# Patient Record
Sex: Female | Born: 1950 | Hispanic: No | Marital: Married | Smoking: Never smoker
Health system: Southern US, Community
[De-identification: ages and names within clinical notes are randomized; demographics above are authoritative.]

## PROBLEM LIST (undated history)

## (undated) DIAGNOSIS — R7303 Prediabetes: Secondary | ICD-10-CM

## (undated) DIAGNOSIS — I10 Essential (primary) hypertension: Secondary | ICD-10-CM

## (undated) DIAGNOSIS — Z923 Personal history of irradiation: Secondary | ICD-10-CM

## (undated) DIAGNOSIS — F419 Anxiety disorder, unspecified: Secondary | ICD-10-CM

## (undated) HISTORY — PX: OTHER SURGICAL HISTORY: SHX169

## (undated) HISTORY — DX: Anxiety disorder, unspecified: F41.9

## (undated) HISTORY — DX: Essential (primary) hypertension: I10

---

## 2002-02-25 ENCOUNTER — Other Ambulatory Visit: Admission: RE | Admit: 2002-02-25 | Discharge: 2002-02-25 | Payer: Self-pay | Admitting: Obstetrics and Gynecology

## 2003-03-08 ENCOUNTER — Other Ambulatory Visit: Admission: RE | Admit: 2003-03-08 | Discharge: 2003-03-08 | Payer: Self-pay | Admitting: Obstetrics and Gynecology

## 2003-09-01 ENCOUNTER — Encounter: Admission: RE | Admit: 2003-09-01 | Discharge: 2003-09-01 | Payer: Self-pay | Admitting: Family Medicine

## 2004-03-22 ENCOUNTER — Other Ambulatory Visit: Admission: RE | Admit: 2004-03-22 | Discharge: 2004-03-22 | Payer: Self-pay | Admitting: Obstetrics and Gynecology

## 2005-03-28 ENCOUNTER — Other Ambulatory Visit: Admission: RE | Admit: 2005-03-28 | Discharge: 2005-03-28 | Payer: Self-pay | Admitting: Obstetrics and Gynecology

## 2005-08-22 ENCOUNTER — Emergency Department (HOSPITAL_COMMUNITY): Admission: EM | Admit: 2005-08-22 | Discharge: 2005-08-22 | Payer: Self-pay | Admitting: Emergency Medicine

## 2006-11-23 ENCOUNTER — Other Ambulatory Visit: Admission: RE | Admit: 2006-11-23 | Discharge: 2006-11-23 | Payer: Self-pay | Admitting: Obstetrics and Gynecology

## 2007-11-26 ENCOUNTER — Encounter: Payer: Self-pay | Admitting: Obstetrics and Gynecology

## 2007-11-26 ENCOUNTER — Other Ambulatory Visit: Admission: RE | Admit: 2007-11-26 | Discharge: 2007-11-26 | Payer: Self-pay | Admitting: Obstetrics and Gynecology

## 2007-11-26 ENCOUNTER — Ambulatory Visit: Payer: Self-pay | Admitting: Obstetrics and Gynecology

## 2008-11-30 ENCOUNTER — Other Ambulatory Visit: Admission: RE | Admit: 2008-11-30 | Discharge: 2008-11-30 | Payer: Self-pay | Admitting: Obstetrics and Gynecology

## 2008-11-30 ENCOUNTER — Ambulatory Visit: Payer: Self-pay | Admitting: Obstetrics and Gynecology

## 2008-11-30 ENCOUNTER — Encounter: Payer: Self-pay | Admitting: Obstetrics and Gynecology

## 2009-10-31 ENCOUNTER — Ambulatory Visit: Payer: Self-pay | Admitting: Obstetrics and Gynecology

## 2009-12-07 ENCOUNTER — Ambulatory Visit: Payer: Self-pay | Admitting: Obstetrics and Gynecology

## 2009-12-07 ENCOUNTER — Other Ambulatory Visit: Admission: RE | Admit: 2009-12-07 | Discharge: 2009-12-07 | Payer: Self-pay | Admitting: Obstetrics and Gynecology

## 2011-01-27 ENCOUNTER — Encounter: Payer: Self-pay | Admitting: Women's Health

## 2011-02-06 ENCOUNTER — Encounter: Payer: Self-pay | Admitting: Women's Health

## 2011-02-14 ENCOUNTER — Ambulatory Visit (INDEPENDENT_AMBULATORY_CARE_PROVIDER_SITE_OTHER): Payer: 59 | Admitting: Women's Health

## 2011-02-14 ENCOUNTER — Encounter: Payer: Self-pay | Admitting: Women's Health

## 2011-02-14 ENCOUNTER — Other Ambulatory Visit (HOSPITAL_COMMUNITY)
Admission: RE | Admit: 2011-02-14 | Discharge: 2011-02-14 | Disposition: A | Discharge status: Home / Self Care | Payer: 59 | Source: Ambulatory Visit | Attending: Women's Health | Admitting: Women's Health

## 2011-02-14 VITALS — BP 128/80 | Ht 65.0 in | Wt 150.5 lb

## 2011-02-14 DIAGNOSIS — Z780 Asymptomatic menopausal state: Secondary | ICD-10-CM

## 2011-02-14 DIAGNOSIS — Z01419 Encounter for gynecological examination (general) (routine) without abnormal findings: Secondary | ICD-10-CM | POA: Insufficient documentation

## 2011-02-14 DIAGNOSIS — Z13220 Encounter for screening for lipoid disorders: Secondary | ICD-10-CM

## 2011-02-14 DIAGNOSIS — Z833 Family history of diabetes mellitus: Secondary | ICD-10-CM

## 2011-02-14 NOTE — Progress Notes (Signed)
Angelica West March 24, 1950 [id number]    History:    The patient presents for annual exam.  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   Past medical history, past surgical history, family history and social history were all reviewed and documented in the EPIC chart.   ROS:  A  ROS was performed and pertinent positives and negatives are included in the history.  Exam:  Filed Vitals:   02/14/11 1548  BP: 128/80    General appearance:  Normal Head/Neck:  Normal, without cervical or supraclavicular adenopathy. Thyroid:  Symmetrical, normal in size, without palpable masses or nodularity. Respiratory  Effort:  Normal  Auscultation:  Clear without wheezing or rhonchi Cardiovascular  Auscultation:  Regular rate, without rubs, murmurs or gallops  Edema/varicosities:  Not grossly evident Abdominal  Soft,nontender, without masses, guarding or rebound.  Liver/spleen:  No organomegaly noted  Hernia:  None appreciated  Skin  Inspection:  Grossly normal  Palpation:  Grossly normal Neurologic/psychiatric  Orientation:  Normal with appropriate conversation.  Mood/affect:  Normal  Genitourinary    Breasts: Examined lying and sitting.     Right: Without masses, retractions, discharge or axillary adenopathy.     Left: Without masses, retractions, discharge or axillary adenopathy.   Inguinal/mons:  Normal without inguinal adenopathy  External genitalia:  Normal  BUS/Urethra/Skene's glands:  Normal  Bladder:  Normal  Vagina:  Normal  Cervix:  Normal  Uterus:   normal in size, shape and contour.  Midline not mobile/adhesive disease   Adnexa/parametria:     Rt: Without masses or tenderness.   Lt: Without masses or tenderness.  Anus and perineum: Normal  Digital rectal exam: Normal sphincter tone without palpated masses or tenderness  Assessment/Plan:  60 y.o. MBF G0 for annual exam. Postmenopausal with no bleeding or HRT. History of normal Paps and mammograms. History of a  negative colonoscopy, not sure of date will check with primary care.  Hypertension-primary care meds Normal postmenopausal exam with vaginal dryness  Plan: CBC, glucose, lipid profile, UA and Pap. Continue vitamin D 2000 daily, history of low vitamin D, normal after supplements. SBEs, continue annual screening, exercise, calcium rich diet, vaginal lubricants with intercourse encouraged. Mother with osteoporosis, has not had a DEXA will schedule. Discussed fall prevention and home safety. Encouraged zostovac and Tdap vaccine at primary care.    Harrington Challenger [facility], 4:36 PM 02/14/2011

## 2011-02-14 NOTE — Patient Instructions (Addendum)
zostavoc/ Shingles vaccine and tdap vaccine  Dexa schedule here

## 2011-02-15 LAB — GLUCOSE, RANDOM: Glucose, Bld: 91 mg/dL (ref 70–99)

## 2011-02-19 ENCOUNTER — Telehealth: Payer: Self-pay | Admitting: *Deleted

## 2011-02-19 NOTE — Telephone Encounter (Signed)
Pt called wanting result from last office, lm on pt vm with normal glucose level

## 2012-02-20 ENCOUNTER — Encounter: Payer: Self-pay | Admitting: Women's Health

## 2012-02-20 ENCOUNTER — Encounter: Payer: Self-pay | Admitting: Obstetrics and Gynecology

## 2012-02-20 ENCOUNTER — Ambulatory Visit (INDEPENDENT_AMBULATORY_CARE_PROVIDER_SITE_OTHER): Payer: 59 | Admitting: Women's Health

## 2012-02-20 VITALS — BP 108/70 | Ht 66.0 in | Wt 153.0 lb

## 2012-02-20 DIAGNOSIS — I10 Essential (primary) hypertension: Secondary | ICD-10-CM

## 2012-02-20 DIAGNOSIS — Z13220 Encounter for screening for lipoid disorders: Secondary | ICD-10-CM

## 2012-02-20 DIAGNOSIS — Z01419 Encounter for gynecological examination (general) (routine) without abnormal findings: Secondary | ICD-10-CM

## 2012-02-20 DIAGNOSIS — Z833 Family history of diabetes mellitus: Secondary | ICD-10-CM

## 2012-02-20 LAB — CBC WITH DIFFERENTIAL/PLATELET
Basophils Absolute: 0 10*3/uL (ref 0.0–0.1)
Basophils Relative: 1 % (ref 0–1)
Eosinophils Absolute: 0.1 10*3/uL (ref 0.0–0.7)
Lymphocytes Relative: 40 % (ref 12–46)
Lymphs Abs: 1.9 10*3/uL (ref 0.7–4.0)
MCHC: 34.3 g/dL (ref 30.0–36.0)
Monocytes Absolute: 0.3 10*3/uL (ref 0.1–1.0)
Neutro Abs: 2.4 10*3/uL (ref 1.7–7.7)
Neutrophils Relative %: 49 % (ref 43–77)
Platelets: 211 10*3/uL (ref 150–400)
RDW: 13.9 % (ref 11.5–15.5)
WBC: 4.8 10*3/uL (ref 4.0–10.5)

## 2012-02-20 LAB — LIPID PANEL
Cholesterol: 173 mg/dL (ref 0–200)
VLDL: 14 mg/dL (ref 0–40)

## 2012-02-20 NOTE — Patient Instructions (Signed)

## 2012-02-20 NOTE — Progress Notes (Signed)
GLYNN FREAS 09-01-1950 [id number]    History:    The patient presents for annual exam.  Postmenopausal on no HRT with no bleeding. Hypertension treated by primary care. Requests labs to be done today. Negative colonoscopy about 10 years ago. History of normal Paps and mammograms. Healthy lifestyle of 5-6 days of exercise per week. Has not had a DEXA, mother with history of osteoporosis.  Past medical history, past surgical history, family history and social history were all reviewed and documented in the EPIC chart. Real Psychologist, occupational.   ROS:  A  ROS was performed and pertinent positives and negatives are included in the history.  Exam:  Filed Vitals:   02/20/12 1558  BP: 108/70    General appearance:  Normal Head/Neck:  Normal, without cervical or supraclavicular adenopathy. Thyroid:  Symmetrical, normal in size, without palpable masses or nodularity. Respiratory  Effort:  Normal  Auscultation:  Clear without wheezing or rhonchi Cardiovascular  Auscultation:  Regular rate, without rubs, murmurs or gallops  Edema/varicosities:  Not grossly evident Abdominal  Soft,nontender, without masses, guarding or rebound.  Liver/spleen:  No organomegaly noted  Hernia:  None appreciated  Skin  Inspection:  Grossly normal  Palpation:  Grossly normal Neurologic/psychiatric  Orientation:  Normal with appropriate conversation.  Mood/affect:  Normal  Genitourinary    Breasts: Examined lying and sitting.     Right: Without masses, retractions, discharge or axillary adenopathy.     Left: Without masses, retractions, discharge or axillary adenopathy.   Inguinal/mons:  Normal without inguinal adenopathy  External genitalia:  Normal  BUS/Urethra/Skene's glands:  Normal  Bladder:  Normal  Vagina:  Normal  Cervix:  Normal  Uterus:   normal in size, shape and contour.  Midline and mobile  Adnexa/parametria:     Rt: Without masses or tenderness.   Lt: Without masses or  tenderness.  Anus and perineum: Normal  Digital rectal exam: Normal sphincter tone without palpated masses or tenderness  Assessment/Plan:  61 y.o. MBF G0 for annual exam with no complaints.  Normal postmenopausal exam/no HRT/no bleeding Hypertension-primary care meds  Plan: CBC, glucose, lipid panel, UA, Pap normal 2012, new screening guidelines reviewed. SBE's, continue annual mammogram, continue regular exercise routine, calcium rich diet, vitamin D 2000 daily encouraged. Vaccines- Zostavac and Tdap reviewed. Reviewed. DEXA declines. Instructed to schedule colonoscopy.    Harrington Challenger [facility], 4:51 PM 02/20/2012

## 2012-02-21 LAB — URINALYSIS W MICROSCOPIC + REFLEX CULTURE
Bacteria, UA: NONE SEEN
Crystals: NONE SEEN
Glucose, UA: NEGATIVE mg/dL
Hgb urine dipstick: NEGATIVE
Leukocytes, UA: NEGATIVE
Specific Gravity, Urine: 1.017 (ref 1.005–1.030)
Urobilinogen, UA: 0.2 mg/dL (ref 0.0–1.0)

## 2012-03-26 ENCOUNTER — Other Ambulatory Visit: Payer: Self-pay | Admitting: Family Medicine

## 2012-03-26 ENCOUNTER — Ambulatory Visit
Admission: RE | Admit: 2012-03-26 | Discharge: 2012-03-26 | Disposition: A | Discharge status: Home / Self Care | Payer: 59 | Source: Ambulatory Visit | Attending: Family Medicine | Admitting: Family Medicine

## 2012-03-26 DIAGNOSIS — M79644 Pain in right finger(s): Secondary | ICD-10-CM

## 2012-04-30 ENCOUNTER — Emergency Department (HOSPITAL_COMMUNITY)
Admission: EM | Admit: 2012-04-30 | Discharge: 2012-04-30 | Disposition: A | Discharge status: Home / Self Care | Payer: 59 | Attending: Emergency Medicine | Admitting: Emergency Medicine

## 2012-04-30 ENCOUNTER — Encounter (HOSPITAL_COMMUNITY): Payer: Self-pay | Admitting: *Deleted

## 2012-04-30 DIAGNOSIS — Z79899 Other long term (current) drug therapy: Secondary | ICD-10-CM | POA: Insufficient documentation

## 2012-04-30 DIAGNOSIS — I10 Essential (primary) hypertension: Secondary | ICD-10-CM | POA: Insufficient documentation

## 2012-04-30 DIAGNOSIS — R197 Diarrhea, unspecified: Secondary | ICD-10-CM | POA: Insufficient documentation

## 2012-04-30 DIAGNOSIS — Z7982 Long term (current) use of aspirin: Secondary | ICD-10-CM | POA: Insufficient documentation

## 2012-04-30 DIAGNOSIS — R112 Nausea with vomiting, unspecified: Secondary | ICD-10-CM | POA: Insufficient documentation

## 2012-04-30 DIAGNOSIS — E860 Dehydration: Secondary | ICD-10-CM | POA: Insufficient documentation

## 2012-04-30 DIAGNOSIS — R55 Syncope and collapse: Secondary | ICD-10-CM | POA: Insufficient documentation

## 2012-04-30 LAB — POCT I-STAT, CHEM 8
BUN: 17 mg/dL (ref 6–23)
Chloride: 104 mEq/L (ref 96–112)
Creatinine, Ser: 1.1 mg/dL (ref 0.50–1.10)
Hemoglobin: 14.6 g/dL (ref 12.0–15.0)
TCO2: 27 mmol/L (ref 0–100)

## 2012-04-30 MED ORDER — SODIUM CHLORIDE 0.9 % IV SOLN
Freq: Once | INTRAVENOUS | Status: AC
  Administered 2012-04-30: 22:00:00 via INTRAVENOUS

## 2012-04-30 NOTE — ED Provider Notes (Signed)
History     CSN: [id number]  Arrival date & time 04/30/12  2003   First MD Initiated Contact with Patient 04/30/12 2025      Chief Complaint  Patient presents with  . Loss of Consciousness    (Consider location/radiation/quality/duration/timing/severity/associated sxs/prior treatment) Patient is a 62 y.o. female presenting with syncope. The history is provided by the patient. No language interpreter was used.  Loss of Consciousness  This is a new (Pt fainted at church this evening.  She says that she had had vomiting and diarrhea this morning.) problem. The current episode started 1 to 2 hours ago. The problem has been resolved. She lost consciousness for a period of less than one minute. Associated symptoms include nausea and vomiting. Pertinent negatives include chest pain, diaphoresis, fever, focal sensory loss, focal weakness and slurred speech. Associated symptoms comments: Diarrhea . Treatments tried: To [facility] ED via EMS. Her past medical history is significant for HTN.    Past Medical History  Diagnosis Date  . Hypertension     Past Surgical History  Procedure Laterality Date  . Bilateral salpingoneostomy, lysis of adhesions, r ov cystectomy      Family History  Problem Relation Age of Onset  . Hypertension Mother   . Osteoporosis Mother   . Stroke Mother     RECENTLY DIED  . Hypertension Father   . Diabetes Father   . Heart disease Father   . Cancer Father     PROSTATE  . Hypertension Sister   . Hypertension Brother   . Cancer Brother     PROSTATE    History  Substance Use Topics  . Smoking status: Never Smoker   . Smokeless tobacco: Never Used  . Alcohol Use: No    OB History   Grav Para Term Preterm Abortions TAB SAB Ect Mult Living   0               Review of Systems  Constitutional: Negative.  Negative for fever and diaphoresis.  HENT: Negative.   Eyes: Negative.   Respiratory: Negative.   Cardiovascular: Positive for syncope.  Negative for chest pain and leg swelling.  Gastrointestinal: Positive for nausea, vomiting and diarrhea.  Genitourinary: Negative.   Musculoskeletal: Negative.   Neurological: Positive for syncope. Negative for focal weakness.  Psychiatric/Behavioral: Negative.     Allergies  Review of patient's allergies indicates no known allergies.  Home Medications   Current Outpatient Rx  Name  Route  Sig  Dispense  Refill  . Ascorbic Acid (VITAMIN C) 100 MG tablet   Oral   Take 100 mg by mouth daily.         Marland Kitchen aspirin 81 MG tablet   Oral   Take 81 mg by mouth daily.           . Calcium Carbonate-Vitamin D (CALCIUM + D PO)   Oral   Take by mouth.           . Cholecalciferol (VITAMIN D PO)   Oral   Take by mouth.           . fish oil-omega-3 fatty acids 1000 MG capsule   Oral   Take 2 g by mouth daily.         Marland Kitchen GARLIC PO   Oral   Take by mouth.           Marland Kitchen GLUCOSAMINE PO   Oral   Take by mouth.           Marland Kitchen  hydrochlorothiazide (HYDRODIURIL) 25 MG tablet   Oral   Take 25 mg by mouth daily.         Marland Kitchen lisinopril (PRINIVIL,ZESTRIL) 10 MG tablet   Oral   Take 10 mg by mouth daily.         . Multiple Vitamin (MULTIVITAMIN) capsule   Oral   Take 1 capsule by mouth daily.           Marland Kitchen zinc gluconate 50 MG tablet   Oral   Take 50 mg by mouth daily.           BP 112/61  Pulse 77  Temp(Src) 99.2 F (37.3 C) (Oral)  Resp 14  SpO2 99%  LMP 03/10/1997  Physical Exam  Nursing note and vitals reviewed. Constitutional: She is oriented to person, place, and time. She appears well-developed and well-nourished. No distress.  HENT:  Head: Normocephalic and atraumatic.  Right Ear: External ear normal.  Left Ear: External ear normal.  Mouth/Throat: Oropharynx is clear and moist.  Eyes: Conjunctivae and EOM are normal. Pupils are equal, round, and reactive to light.  Neck: Normal range of motion. Neck supple.  Cardiovascular: Normal rate, regular rhythm  and normal heart sounds.   Pulmonary/Chest: Effort normal and breath sounds normal.  Abdominal: Soft. Bowel sounds are normal. She exhibits no distension. There is no tenderness.  Musculoskeletal: Normal range of motion. She exhibits no edema and no tenderness.  Neurological: She is alert and oriented to person, place, and time.  No sensory or motor deficit.  Skin: Skin is warm and dry.  Psychiatric: She has a normal mood and affect. Her behavior is normal.    ED Course  Procedures (including critical care time)  Results for orders placed during the hospital encounter of 04/30/12  POCT I-STAT, CHEM 8      Result Value Range   Sodium 138  135 - 145 mEq/L   Potassium 3.8  3.5 - 5.1 mEq/L   Chloride 104  96 - 112 mEq/L   BUN 17  6 - 23 mg/dL   Creatinine, Ser 1.61  0.50 - 1.10 mg/dL   Glucose, Bld 096 (*) 70 - 99 mg/dL   Calcium, Ion 0.45  4.09 - 1.30 mmol/L   TCO2 27  0 - 100 mmol/L   Hemoglobin 14.6  12.0 - 15.0 g/dL   HCT 81.1  91.4 - 78.2 %    Date: 04/30/2012  Rate: 78  Rhythm: normal sinus rhythm  QRS Axis: normal PQRS:  Left atrial abnormality  Intervals: normal  ST/T Wave abnormalities: normal  Conduction Disutrbances:none  Narrative Interpretation: Borderline EKg  Old EKG Reviewed: none available  Lab tests were normal.  Pt received a liter of IV fluids, felt well, was ready to go home.  Released.    1. Syncope   2. Dehydration         Carleene Cooper III, MD 05/01/12 1215

## 2012-04-30 NOTE — ED Notes (Signed)
Pt ambulated in hall with no difficulty. Denies blurred vision, dizziness, lightheadedness.

## 2012-04-30 NOTE — ED Notes (Signed)
MD Davidson at bedside.

## 2012-04-30 NOTE — ED Notes (Signed)
Per EMS: Pt has been feeling sick for today with N/V/D. Pt states that she has not eaten well nor drank well today. Pt went to church and had a syncopal episode. EMS arrived and fire sat up from lying on floor and she passed out again. Pt given bolus and BP went from 90's systolic to 120's.

## 2013-02-25 ENCOUNTER — Encounter: Payer: Self-pay | Admitting: Gynecology

## 2013-02-25 ENCOUNTER — Ambulatory Visit (INDEPENDENT_AMBULATORY_CARE_PROVIDER_SITE_OTHER): Payer: 59 | Admitting: Gynecology

## 2013-02-25 VITALS — BP 130/72 | Ht 65.25 in | Wt 148.0 lb

## 2013-02-25 DIAGNOSIS — Z1159 Encounter for screening for other viral diseases: Secondary | ICD-10-CM

## 2013-02-25 DIAGNOSIS — N952 Postmenopausal atrophic vaginitis: Secondary | ICD-10-CM

## 2013-02-25 DIAGNOSIS — N951 Menopausal and female climacteric states: Secondary | ICD-10-CM

## 2013-02-25 DIAGNOSIS — Z780 Asymptomatic menopausal state: Secondary | ICD-10-CM

## 2013-02-25 DIAGNOSIS — Z01419 Encounter for gynecological examination (general) (routine) without abnormal findings: Secondary | ICD-10-CM

## 2013-02-25 LAB — CBC WITH DIFFERENTIAL/PLATELET
Basophils Absolute: 0 10*3/uL (ref 0.0–0.1)
Basophils Relative: 1 % (ref 0–1)
Hemoglobin: 13.4 g/dL (ref 12.0–15.0)
Lymphocytes Relative: 39 % (ref 12–46)
Lymphs Abs: 1.6 10*3/uL (ref 0.7–4.0)
Monocytes Absolute: 0.3 10*3/uL (ref 0.1–1.0)
Neutrophils Relative %: 51 % (ref 43–77)
Platelets: 241 10*3/uL (ref 150–400)
RDW: 13.1 % (ref 11.5–15.5)
WBC: 4.1 10*3/uL (ref 4.0–10.5)

## 2013-02-25 LAB — CHOLESTEROL, TOTAL: Cholesterol: 164 mg/dL (ref 0–200)

## 2013-02-25 LAB — COMPREHENSIVE METABOLIC PANEL
ALT: 26 U/L (ref 0–35)
AST: 26 U/L (ref 0–37)
Albumin: 4.2 g/dL (ref 3.5–5.2)
CO2: 29 mEq/L (ref 19–32)
Calcium: 9.5 mg/dL (ref 8.4–10.5)
Creat: 0.99 mg/dL (ref 0.50–1.10)
Sodium: 137 mEq/L (ref 135–145)

## 2013-02-25 LAB — URINALYSIS W MICROSCOPIC + REFLEX CULTURE
Bacteria, UA: NONE SEEN
Casts: NONE SEEN
Crystals: NONE SEEN
Hgb urine dipstick: NEGATIVE
Ketones, ur: NEGATIVE mg/dL

## 2013-02-25 LAB — TSH: TSH: 1.843 u[IU]/mL (ref 0.350–4.500)

## 2013-02-25 MED ORDER — NONFORMULARY OR COMPOUNDED ITEM: Status: DC

## 2013-02-25 NOTE — Progress Notes (Signed)
Angelica West 1951-01-24 [id number]   History:    62 y.o.  for annwith the helpual gyn exam with the only complained of soreness of the bottom of her feet after she exercises. Patient has never been on any hormone replacement therapy in the past but is sexually active and is complaining of vaginal dryness and irritation. Patient with no prior history of any abnormal Pap smears. Patient has never had a bone density study in the past. Patient's mother has history of osteoporosis. Patient's PCP Dr. Juluis Rainier has administered her Tdap and flu vaccine but the patient has not received the shingles vaccines yet. Her colonoscopy was approximately 10 years ago and reported to be normal.  Past medical history,surgical history, family history and social history were all reviewed and documented in the EPIC chart.  Gynecologic History Patient's last menstrual period was 03/10/1997. Contraception: post menopausal status Last Pap: 2012. Results were: normal Last mammogram: 2013. Results were: then some but normal 3 dimensional mammogram  Obstetric History OB History  Gravida Para Term Preterm AB SAB TAB Ectopic Multiple Living  0                  ROS: A ROS was performed and pertinent positives and negatives are included in the history.  GENERAL: No fevers or chills. HEENT: No change in vision, no earache, sore throat or sinus congestion. NECK: No pain or stiffness. CARDIOVASCULAR: No chest pain or pressure. No palpitations. PULMONARY: No shortness of breath, cough or wheeze. GASTROINTESTINAL: No abdominal pain, nausea, vomiting or diarrhea, melena or bright red blood per rectum. GENITOURINARY: No urinary frequency, urgency, hesitancy or dysuria. MUSCULOSKELETAL: No joint or muscle pain, no back pain, no recent trauma. DERMATOLOGIC: No rash, no itching, no lesions. ENDOCRINE: No polyuria, polydipsia, no heat or cold intolerance. No recent change in weight. HEMATOLOGICAL: No anemia or easy  bruising or bleeding. NEUROLOGIC: No headache, seizures, numbness, tingling or weakness. PSYCHIATRIC: No depression, no loss of interest in normal activity or change in sleep pattern.     Exam: chaperone present  BP 130/72  Ht 5' 5.25" (1.657 m)  Wt 67.132 kg (148 lb)  BMI 24.45 kg/m2  LMP 03/10/1997  Body mass index is 24.45 kg/(m^2).  General appearance : Well developed well nourished female. No acute distress HEENT: Neck supple, trachea midline, no carotid bruits, no thyroidmegaly Lungs: Clear to auscultation, no rhonchi or wheezes, or rib retractions  Heart: Regular rate and rhythm, no murmurs or gallops Breast:Examined in sitting and supine position were symmetrical in appearance, no palpable masses or tenderness,  no skin retraction, no nipple inversion, no nipple discharge, no skin discoloration, no axillary or supraclavicular lymphadenopathy Abdomen: no palpable masses or tenderness, no rebound or guarding Extremities: no edema or skin discoloration or tenderness  Pelvic:  Bartholin, Urethra, Skene Glands: Within normal limits             Vagina: No gross lesions or discharge, atrophic changes  Cervix: No gross lesions or discharge  Uterus  axial, normal size, shape and consistency, non-tender and mobile  Adnexa  Without masses or tenderness  Anus and perineum  normal   Rectovaginal  normal sphincter tone without palpated masses or tenderness             Hemoccult card provided     Assessment/Plan:  62 y.o. female for annual exam who was reminded to schedule her bone density as well as her colonoscopy and mammogram which are all to. We  discussed importance of calcium and vitamin D in regular exercise for osteoporosis prevention. We discussed importance of monthly breast exam. Prescription for shingles vaccine was provided. The following labs were ordered: CBC, fasting lipid profile, comprehensive metabolic panel, TSH, as well as urinalysis. Pap smear was not done in  accordance to the new guidelines. Hemoccult cards were provided for the patient to submit to the office for testing.Patient would be prescribed vaginal estradiol 0.02% to apply twice a week. Risks benefits and pros and cons of estrogen replacement therapy were discussed.  New CDC guidelines is recommending patients be tested once in her lifetime for hepatitis C antibody who were born between [birth year] through 1966. This was discussed with the patient today and has agreed to be tested today.  Note: This dictation was prepared with  Dragon/digital dictation along withSmart phrase technology. Any transcriptional errors that result from this process are unintentional.   Ok Edwards MD, 9:04 AM 02/25/2013

## 2013-02-25 NOTE — Patient Instructions (Signed)
Shingles Vaccine  What You Need to Know  WHAT IS SHINGLES?  · Shingles is a painful skin rash, often with blisters. It is also called Herpes Zoster or just Zoster.  · A shingles rash usually appears on one side of the face or body and lasts from 2 to 4 weeks. Its main symptom is pain, which can be quite severe. Other symptoms of shingles can include fever, headache, chills, and upset stomach. Very rarely, a shingles infection can lead to pneumonia, hearing problems, blindness, brain inflammation (encephalitis), or death.  · For about 1 person in 5, severe pain can continue even after the rash clears up. This is called post-herpetic neuralgia.  · Shingles is caused by the Varicella Zoster virus. This is the same virus that causes chickenpox. Only someone who has had a case of chickenpox or rarely, has gotten chickenpox vaccine, can get shingles. The virus stays in your body. It can reappear many years later to cause a case of shingles.  · You cannot catch shingles from another person with shingles. However, a person who has never had chickenpox (or chickenpox vaccine) could get chickenpox from someone with shingles. This is not very common.  · Shingles is far more common in people 50 and older than in younger people. It is also more common in people whose immune systems are weakened because of a disease such as cancer or drugs such as steroids or chemotherapy.  · At least 1 million people get shingles per year in the [place].  SHINGLES VACCINE  · A vaccine for shingles was licensed in 2006. In clinical trials, the vaccine reduced the risk of shingles by 50%. It can also reduce the pain in people who still get shingles after being vaccinated.  · A single dose of shingles vaccine is recommended for adults 60 years of age and older.  SOME PEOPLE SHOULD NOT GET SHINGLES VACCINE OR SHOULD WAIT  A person should not get shingles vaccine if he or she:  · Has ever had a life-threatening allergic reaction to gelatin, the  antibiotic neomycin, or any other component of shingles vaccine. Tell your caregiver if you have any severe allergies.  · Has a weakened immune system because of current:  · AIDS or another disease that affects the immune system.  · Treatment with drugs that affect the immune system, such as prolonged use of high-dose steroids.  · Cancer treatment, such as radiation or chemotherapy.  · Cancer affecting the bone marrow or lymphatic system, such as leukemia or lymphoma.  · Is pregnant, or might be pregnant. Women should not become pregnant until at least 4 weeks after getting shingles vaccine.  Someone with a minor illness, such as a cold, may be vaccinated. Anyone with a moderate or severe acute illness should usually wait until he or she recovers before getting the vaccine. This includes anyone with a temperature of 101.3° F (38° C) or higher.  WHAT ARE THE RISKS FROM SHINGLES VACCINE?  · A vaccine, like any medicine, could possibly cause serious problems, such as severe allergic reactions. However, the risk of a vaccine causing serious harm, or death, is extremely small.  · No serious problems have been identified with shingles vaccine.  Mild Problems  · Redness, soreness, swelling, or itching at the site of the injection (about 1 person in 3).  · Headache (about 1 person in 70).  Like all vaccines, shingles vaccine is being closely monitored for unusual or severe problems.  WHAT IF   THERE IS A MODERATE OR SEVERE REACTION?  What should I look for?  Any unusual condition, such as a severe allergic reaction or a high fever. If a severe allergic reaction occurred, it would be within a few minutes to an hour after the shot. Signs of a serious allergic reaction can include difficulty breathing, weakness, hoarseness or wheezing, a fast heartbeat, hives, dizziness, paleness, or swelling of the throat.  What should I do?  · Call your caregiver, or get the person to a caregiver right away.  · Tell the caregiver what  happened, the date and time it happened, and when the vaccination was given.  · Ask the caregiver to report the reaction by filing a Vaccine Adverse Event Reporting System (VAERS) form. Or, you can file this report through the VAERS web site at www.vaers.hhs.gov or by calling 1-800-822-7967.  VAERS does not provide medical advice.  HOW CAN I LEARN MORE?  · Ask your caregiver. He or she can give you the vaccine package insert or suggest other sources of information.  · Contact the Centers for Disease Control and Prevention (CDC):  · Call 1-800-232-4636 (1-800-CDC-INFO).  · Visit the CDC website at www.cdc.gov/vaccines  CDC Shingles Vaccine VIS (12/14/07)  Document Released: 12/22/2005 Document Revised: 05/19/2011 Document Reviewed: 06/16/2012  Exit[employer]® Patient Information ©2014 [employer].

## 2013-03-09 ENCOUNTER — Encounter: Payer: Self-pay | Admitting: Gynecology

## 2013-03-16 ENCOUNTER — Telehealth: Payer: Self-pay | Admitting: *Deleted

## 2013-03-16 NOTE — Telephone Encounter (Signed)
Pt called requesting AVS to be mailed to home address. This was done.

## 2013-03-30 ENCOUNTER — Other Ambulatory Visit: Payer: 59 | Admitting: Anesthesiology

## 2013-03-30 DIAGNOSIS — Z1211 Encounter for screening for malignant neoplasm of colon: Secondary | ICD-10-CM

## 2014-03-08 ENCOUNTER — Other Ambulatory Visit (HOSPITAL_COMMUNITY)
Admission: RE | Admit: 2014-03-08 | Discharge: 2014-03-08 | Disposition: A | Discharge status: Home / Self Care | Payer: 59 | Source: Ambulatory Visit | Attending: Gynecology | Admitting: Gynecology

## 2014-03-08 ENCOUNTER — Encounter: Payer: 59 | Admitting: Gynecology

## 2014-03-08 ENCOUNTER — Ambulatory Visit (INDEPENDENT_AMBULATORY_CARE_PROVIDER_SITE_OTHER): Payer: 59 | Admitting: Women's Health

## 2014-03-08 ENCOUNTER — Encounter: Payer: Self-pay | Admitting: Women's Health

## 2014-03-08 VITALS — BP 125/78 | Ht 66.0 in | Wt 154.4 lb

## 2014-03-08 DIAGNOSIS — Z01419 Encounter for gynecological examination (general) (routine) without abnormal findings: Secondary | ICD-10-CM | POA: Diagnosis present

## 2014-03-08 NOTE — Progress Notes (Signed)
Angelica West 1950-12-16 [id number]    History:    Presents for annual exam.  Postmenopausal/no bleeding/no HRT. Mild vaginal dryness. Normal Pap and mammogram history. Hypertension primary care manages. Has not had a DEXA states will wait until she is 64. 2015 negative colonoscopy. Has had Zostavax and plans to have Pneumovax at age 63.  Past medical history, past surgical history, family history and social history were all reviewed and documented in the EPIC chart. Real Investment banker, corporate. Father diabetes, mother stroke died at age 33.  ROS:  A ROS was performed and pertinent positives and negatives are included.  Exam:  Filed Vitals:   03/08/14 1608  BP: 125/78    General appearance:  Normal Thyroid:  Symmetrical, normal in size, without palpable masses or nodularity. Respiratory  Auscultation:  Clear without wheezing or rhonchi Cardiovascular  Auscultation:  Regular rate, without rubs, murmurs or gallops  Edema/varicosities:  Not grossly evident Abdominal  Soft,nontender, without masses, guarding or rebound.  Liver/spleen:  No organomegaly noted  Hernia:  None appreciated  Skin  Inspection:  Grossly normal   Breasts: Examined lying and sitting.     Right: Without masses, retractions, discharge or axillary adenopathy.     Left: Without masses, retractions, discharge or axillary adenopathy. Gentitourinary   Inguinal/mons:  Normal without inguinal adenopathy  External genitalia:  Normal  BUS/Urethra/Skene's glands:  Normal  Vagina:  Atrophic  Cervix:  Normal  Uterus:   normal in size, shape and contour.  Midline and mobile  Adnexa/parametria:     Rt: Without masses or tenderness.   Lt: Without masses or tenderness.  Anus and perineum: Normal  Digital rectal exam: Normal sphincter tone without palpated masses or tenderness  Assessment/Plan:  63 y.o. MBF G0 for annual exam with no complaints.  Hypertension - primary care manages labs and meds Postmenopausal no  bleeding/no HRT  Plan: SBE's, continue annual 3-D tomography history of dense breast. Continue regular exercise, calcium rich diet, vitamin D 2000 daily encouraged. Instructed to have vitamin D level checked with next labs at primary care. Home safety, fall prevention and importance of regular weightbearing exercises reviewed. Vaginal lubricants with intercourse as needed. Pap with HR HPV typing, Pap normal 2012, new screening guidelines reviewed.    [place], 4:56 PM 03/08/2014

## 2014-03-13 LAB — CYTOLOGY - PAP

## 2014-03-14 ENCOUNTER — Encounter: Payer: Self-pay | Admitting: Gynecology

## 2015-03-29 ENCOUNTER — Encounter: Payer: Self-pay | Admitting: Gynecology

## 2015-05-04 ENCOUNTER — Encounter: Payer: Self-pay | Admitting: Gynecology

## 2015-05-07 ENCOUNTER — Ambulatory Visit (INDEPENDENT_AMBULATORY_CARE_PROVIDER_SITE_OTHER): Payer: 59 | Admitting: Gynecology

## 2015-05-07 ENCOUNTER — Encounter: Payer: Self-pay | Admitting: Gynecology

## 2015-05-07 VITALS — BP 116/70 | Ht 66.0 in | Wt 154.0 lb

## 2015-05-07 DIAGNOSIS — I10 Essential (primary) hypertension: Secondary | ICD-10-CM | POA: Diagnosis not present

## 2015-05-07 DIAGNOSIS — Z01419 Encounter for gynecological examination (general) (routine) without abnormal findings: Secondary | ICD-10-CM

## 2015-05-07 DIAGNOSIS — Z780 Asymptomatic menopausal state: Secondary | ICD-10-CM

## 2015-05-07 DIAGNOSIS — R252 Cramp and spasm: Secondary | ICD-10-CM

## 2015-05-07 LAB — POTASSIUM: Potassium: 3.7 mmol/L (ref 3.5–5.3)

## 2015-05-07 NOTE — Patient Instructions (Signed)

## 2015-05-07 NOTE — Progress Notes (Signed)
Angelica West [birth year] [id number]   History:    65 y.o.  for annual gyn exam who is only complaint is of [place] cramps on and off at times. She has not seen her PCP in over a year. She is taking lisinopril for hypertension. Patient is postmenopausal on no hormone replacement therapy. Patient's last colonoscopy reported to be normal in 2015. Several years ago she was placed on vaginal estrogen twice a week and she is no longer taking it states that she does not needed. She still has not had her bone density study.  Past medical history,surgical history, family history and social history were all reviewed and documented in the EPIC chart.  Gynecologic History Patient's last menstrual period was 03/10/1997. Contraception: post menopausal status Last Pap: 2015. Results were: normal Last mammogram: 2017. Results were: Normal but dense/had three-dimensional mammogram  Obstetric History OB History  Gravida Para Term Preterm AB SAB TAB Ectopic Multiple Living  0                  ROS: A ROS was performed and pertinent positives and negatives are included in the history.  GENERAL: No fevers or chills. HEENT: No change in vision, no earache, sore throat or sinus congestion. NECK: No pain or stiffness. CARDIOVASCULAR: No chest pain or pressure. No palpitations. PULMONARY: No shortness of breath, cough or wheeze. GASTROINTESTINAL: No abdominal pain, nausea, vomiting or diarrhea, melena or bright red blood per rectum. GENITOURINARY: No urinary frequency, urgency, hesitancy or dysuria. MUSCULOSKELETAL: No joint or muscle pain, no back pain, no recent trauma. DERMATOLOGIC: No rash, no itching, no lesions. ENDOCRINE: No polyuria, polydipsia, no heat or cold intolerance. No recent change in weight. HEMATOLOGICAL: No anemia or easy bruising or bleeding. NEUROLOGIC: No headache, seizures, numbness, tingling or weakness. PSYCHIATRIC: No depression, no loss of interest in normal activity or change in sleep  pattern.     Exam: chaperone present  BP 116/70 mmHg  Ht 5\' 6"  (1.676 m)  Wt 154 lb (69.854 kg)  BMI 24.87 kg/m2  LMP 03/10/1997  Body mass index is 24.87 kg/(m^2).  General appearance : Well developed well nourished female. No acute distress HEENT: Eyes: no retinal hemorrhage or exudates,  Neck supple, trachea midline, no carotid bruits, no thyroidmegaly Lungs: Clear to auscultation, no rhonchi or wheezes, or rib retractions  Heart: Regular rate and rhythm, no murmurs or gallops Breast:Examined in sitting and supine position were symmetrical in appearance, no palpable masses or tenderness,  no skin retraction, no nipple inversion, no nipple discharge, no skin discoloration, no axillary or supraclavicular lymphadenopathy Abdomen: no palpable masses or tenderness, no rebound or guarding Extremities: no edema or skin discoloration or tenderness  Pelvic:  Bartholin, Urethra, Skene Glands: Within normal limits             Vagina: No gross lesions or discharge, atrophic changes  Cervix: No gross lesions or discharge  Uterus  anteverted, normal size, shape and consistency, non-tender and mobile  Adnexa  Without masses or tenderness  Anus and perineum  normal   Rectovaginal  normal sphincter tone without palpated masses or tenderness             Hemoccult PCP will provide     Assessment/Plan:  65 y.o. female for annual exam because of her lower extremity cramps on and off and her being on a diuretic I want to check her potassium level today. She was reminded to make an appointment with her PCP. We discussed  also the new guidelines that she will no longer need Pap smears. She will schedule a bone density study here in our office for her first baseline. She was reminded to do her monthly breast exams. We discussed importance of calcium vitamin D and weightbearing exercises for osteoporosis prevention.   Terrance Mass MD, 9:17 AM 05/07/2015

## 2016-06-05 ENCOUNTER — Other Ambulatory Visit: Payer: Self-pay | Admitting: Obstetrics and Gynecology

## 2016-06-05 DIAGNOSIS — R928 Other abnormal and inconclusive findings on diagnostic imaging of breast: Secondary | ICD-10-CM

## 2016-06-09 ENCOUNTER — Other Ambulatory Visit: Payer: Self-pay | Admitting: Obstetrics and Gynecology

## 2016-06-09 ENCOUNTER — Ambulatory Visit
Admission: RE | Admit: 2016-06-09 | Discharge: 2016-06-09 | Disposition: A | Discharge status: Home / Self Care | Payer: 59 | Source: Ambulatory Visit | Attending: Obstetrics and Gynecology | Admitting: Obstetrics and Gynecology

## 2016-06-09 DIAGNOSIS — R928 Other abnormal and inconclusive findings on diagnostic imaging of breast: Secondary | ICD-10-CM

## 2016-06-10 ENCOUNTER — Ambulatory Visit
Admission: RE | Admit: 2016-06-10 | Discharge: 2016-06-10 | Disposition: A | Discharge status: Home / Self Care | Payer: 59 | Source: Ambulatory Visit | Attending: Obstetrics and Gynecology | Admitting: Obstetrics and Gynecology

## 2016-06-10 ENCOUNTER — Other Ambulatory Visit: Payer: Self-pay | Admitting: Obstetrics and Gynecology

## 2016-06-10 DIAGNOSIS — R928 Other abnormal and inconclusive findings on diagnostic imaging of breast: Secondary | ICD-10-CM

## 2016-06-11 ENCOUNTER — Ambulatory Visit
Admission: RE | Admit: 2016-06-11 | Discharge: 2016-06-11 | Disposition: A | Discharge status: Home / Self Care | Payer: 59 | Source: Ambulatory Visit | Attending: Obstetrics and Gynecology | Admitting: Obstetrics and Gynecology

## 2016-06-13 ENCOUNTER — Telehealth: Payer: Self-pay | Admitting: *Deleted

## 2016-06-13 NOTE — Telephone Encounter (Signed)
Left vm regarding [place] for 4.11.18. Contact information provided.

## 2016-06-13 NOTE — Telephone Encounter (Signed)
Confirmed [facility] for 06/18/16 at 1215 .  Instructions and contact information given.

## 2016-06-17 ENCOUNTER — Other Ambulatory Visit: Payer: Self-pay | Admitting: *Deleted

## 2016-06-17 DIAGNOSIS — Z170 Estrogen receptor positive status [ER+]: Principal | ICD-10-CM

## 2016-06-17 DIAGNOSIS — C50412 Malignant neoplasm of upper-outer quadrant of left female breast: Secondary | ICD-10-CM | POA: Insufficient documentation

## 2016-06-18 ENCOUNTER — Ambulatory Visit
Admission: RE | Admit: 2016-06-18 | Discharge: 2016-06-18 | Disposition: A | Discharge status: Home / Self Care | Payer: 59 | Source: Ambulatory Visit | Attending: Radiation Oncology | Admitting: Radiation Oncology

## 2016-06-18 ENCOUNTER — Ambulatory Visit: Payer: 59 | Attending: Surgery | Admitting: Physical Therapy

## 2016-06-18 ENCOUNTER — Encounter: Payer: Self-pay | Admitting: Physical Therapy

## 2016-06-18 ENCOUNTER — Other Ambulatory Visit (HOSPITAL_BASED_OUTPATIENT_CLINIC_OR_DEPARTMENT_OTHER): Payer: 59

## 2016-06-18 ENCOUNTER — Ambulatory Visit: Payer: Self-pay | Admitting: Surgery

## 2016-06-18 ENCOUNTER — Encounter: Payer: Self-pay | Admitting: Oncology

## 2016-06-18 ENCOUNTER — Ambulatory Visit (HOSPITAL_BASED_OUTPATIENT_CLINIC_OR_DEPARTMENT_OTHER): Payer: 59 | Admitting: Oncology

## 2016-06-18 VITALS — BP 130/67 | HR 69 | Temp 98.2°F | Resp 18 | Ht 66.0 in | Wt 148.7 lb

## 2016-06-18 DIAGNOSIS — C50412 Malignant neoplasm of upper-outer quadrant of left female breast: Secondary | ICD-10-CM | POA: Insufficient documentation

## 2016-06-18 DIAGNOSIS — Z170 Estrogen receptor positive status [ER+]: Secondary | ICD-10-CM | POA: Insufficient documentation

## 2016-06-18 DIAGNOSIS — Z823 Family history of stroke: Secondary | ICD-10-CM | POA: Insufficient documentation

## 2016-06-18 DIAGNOSIS — Z833 Family history of diabetes mellitus: Secondary | ICD-10-CM | POA: Insufficient documentation

## 2016-06-18 DIAGNOSIS — I10 Essential (primary) hypertension: Secondary | ICD-10-CM | POA: Insufficient documentation

## 2016-06-18 DIAGNOSIS — D0512 Intraductal carcinoma in situ of left breast: Secondary | ICD-10-CM

## 2016-06-18 DIAGNOSIS — Z510 Encounter for antineoplastic radiation therapy: Secondary | ICD-10-CM | POA: Insufficient documentation

## 2016-06-18 DIAGNOSIS — Z8249 Family history of ischemic heart disease and other diseases of the circulatory system: Secondary | ICD-10-CM | POA: Insufficient documentation

## 2016-06-18 DIAGNOSIS — R293 Abnormal posture: Secondary | ICD-10-CM | POA: Diagnosis present

## 2016-06-18 DIAGNOSIS — Z79899 Other long term (current) drug therapy: Secondary | ICD-10-CM | POA: Insufficient documentation

## 2016-06-18 DIAGNOSIS — F419 Anxiety disorder, unspecified: Secondary | ICD-10-CM | POA: Insufficient documentation

## 2016-06-18 DIAGNOSIS — Z809 Family history of malignant neoplasm, unspecified: Secondary | ICD-10-CM | POA: Insufficient documentation

## 2016-06-18 LAB — COMPREHENSIVE METABOLIC PANEL
ALBUMIN: 3.9 g/dL (ref 3.5–5.0)
ALK PHOS: 80 U/L (ref 40–150)
ALT: 22 U/L (ref 0–55)
AST: 22 U/L (ref 5–34)
Anion Gap: 9 mEq/L (ref 3–11)
BILIRUBIN TOTAL: 0.43 mg/dL (ref 0.20–1.20)
BUN: 16.6 mg/dL (ref 7.0–26.0)
CALCIUM: 9.8 mg/dL (ref 8.4–10.4)
CO2: 28 mEq/L (ref 22–29)
CREATININE: 1 mg/dL (ref 0.6–1.1)
Chloride: 104 mEq/L (ref 98–109)
EGFR: 60 mL/min/{1.73_m2} — ABNORMAL LOW (ref >90)
Glucose: 89 mg/dl (ref 70–140)
Potassium: 3.9 mEq/L (ref 3.5–5.1)
Sodium: 141 mEq/L (ref 136–145)
TOTAL PROTEIN: 7.4 g/dL (ref 6.4–8.3)

## 2016-06-18 LAB — CBC WITH DIFFERENTIAL/PLATELET
BASO%: 0.4 % (ref 0.0–2.0)
BASOS ABS: 0 10*3/uL (ref 0.0–0.1)
EOS%: 1.8 % (ref 0.0–7.0)
Eosinophils Absolute: 0.1 10*3/uL (ref 0.0–0.5)
HEMATOCRIT: 37.4 % (ref 34.8–46.6)
HGB: 12.5 g/dL (ref 11.6–15.9)
LYMPH%: 37.2 % (ref 14.0–49.7)
MCH: 29.6 pg (ref 25.1–34.0)
MCHC: 33.4 g/dL (ref 31.5–36.0)
MCV: 88.4 fL (ref 79.5–101.0)
MONO#: 0.5 10*3/uL (ref 0.1–0.9)
MONO%: 9 % (ref 0.0–14.0)
NEUT#: 2.7 10*3/uL (ref 1.5–6.5)
NEUT%: 51.6 % (ref 38.4–76.8)
Platelets: 218 10*3/uL (ref 145–400)
RBC: 4.23 10*6/uL (ref 3.70–5.45)
RDW: 13.8 % (ref 11.2–14.5)
WBC: 5.1 10*3/uL (ref 3.9–10.3)
lymph#: 1.9 10*3/uL (ref 0.9–3.3)

## 2016-06-18 NOTE — Patient Instructions (Signed)

## 2016-06-18 NOTE — H&P (Signed)
Angelica West 06/18/2016 7:52 AM Location: [facility] Patient #: [id number] DOB: 12/10/1950 Undefined / Language: English / Race: Refused to Report/Unreported Female  History of Present Illness (Angelica West A. Shandrika Ambers MD; 06/18/2016 2:44 PM) Patient words: Pt sent at Angelica reuest of Dr Angelica West for abnormal left breast mammogram. 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 Pt denies hx of breast mass, pain or nipple discharge. Pt has a family hostory of breast cancer. Denies any other complaints. She is sore a Angelica biopsy site.               ADDENDUM REPORT: 06/10/2016 14:37 ADDENDUM: Patient presented 06/10/2016 for a stereotactic guided core biopsy of left breast calcifications. I met with Angelica patient we discussed Angelica procedure of stereotactic guided core biopsy. Angelica patient was positioned for Angelica biopsy and calcifications were identified on stereotactic images. Using sterile technique, 1 percent lidocaine was used for local anesthetic. A dermatotomy was made and Angelica needle was placed in preparation for Angelica biopsy. However, Angelica patient had a vasovagal reaction and Angelica needle was removed without tissue sampling. Angelica patient had initial blood pressure of 80's/30's, with pulse in Angelica lower 50s. Patient was supported with oxygen and Trendelenburg position until blood pressure returned to 109/65 and pulse of 64. She was observed for another 30-45 minutes was able to eat and drink without difficulty. Patient was discharged home. Procedure has been rescheduled for stereotactic biopsy on 06/11/2016. Electronically Signed By: Angelica West West.D. On: 06/10/2016 14:37 Addended by Angelica Brown, MD on 06/10/2016 2:39 PM  Study Result CLINICAL DATA: Patient returns today to evaluate left breast calcifications identified on recent screening mammogram. EXAM: DIGITAL DIAGNOSTIC LEFT  MAMMOGRAM COMPARISON: Previous exam(s). ACR Breast Density Category c: Angelica breast tissue is heterogeneously dense, which may obscure small masses. FINDINGS: Grouped punctate and amorphous calcifications are confirmed within Angelica upper-outer quadrant of Angelica left breast, at posterior depth, with a suspicious linear distribution spanning approximately 3.5 cm. IMPRESSION: Suspicious punctate and amorphous calcifications within Angelica upper-outer quadrant of Angelica left breast, at posterior depth, for which stereotactic biopsy is recommended. RECOMMENDATION: Stereotactic biopsy for Angelica left breast calcifications. Stereotactic biopsy is scheduled for April 3rd at 11:30 Angelicam. I have discussed Angelica findings and recommendations with Angelica patient. Results were also provided in writing at Angelica conclusion of Angelica visit. If applicable, a reminder letter will be sent to Angelica patient regarding Angelica next appointment. BI-RADS CATEGORY 4: Suspicious. Electronically Signed: By: Angelica West West.D. 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 Microscopic Comment Angelica carcinoma appears intermediate grade. Prognostic markers will be ordered. Dr. Kish has reviewed Angelica case. Angelica case was called to Angelica West on 06/12/2016. 1 of 2 FINAL for Angelica West, Angelica West ([id number]) Angelica MANNY MD Pathologist,  Electronic Signature (Case signed 06/12/2016) Specimen Gross and Clinical Information Specimen Comment In formalin 12:05 pm extracted < 5 min; calcifications left breast, UOQ Specimen(s) Obtained: Breast, left, needle core biopsy, upper outer quadrant Specimen   Clinical Information Suspect DCIS, possible Favoid changes or FCC Gross Received in formalin (TIF 12:05pm, CIT less than 5 minutes), labeled with Angelica patient's.  Angelica patient is a 65 year old female.   Past Surgical History (Angelica Smith, RN; 06/18/2016 7:52 AM) Breast Biopsy Left.  Diagnostic Studies History (Angelica Smith, RN; 06/18/2016 7:52 AM) Colonoscopy 1-5 years ago Mammogram within last year Pap Smear 1-5 years ago  Medication History (Angelica Smith, RN; 06/18/2016 7:52 AM) Medications Reconciled  Social History (Angelica Smith, RN; 06/18/2016 7:52 AM) Alcohol use Occasional alcohol use. Caffeine use Carbonated beverages, Coffee. Illicit drug use Remotely quit drug use. Tobacco use Never smoker.  Family History (Angelica Smith, RN; 06/18/2016 7:52 AM) Cerebrovascular Accident Mother. Diabetes Mellitus Father. Heart Disease Father. Hypertension Brother, Father, Mother, Sister. Prostate Cancer Brother, Father.  Pregnancy / Birth History (Angelica Smith, RN; 06/18/2016 7:52 AM) Age of menopause 51-54 Contraceptive History Oral contraceptives.  Other Problems (Angelica Smith, RN; 06/18/2016 7:52 AM) Anxiety Disorder Arthritis High blood pressure Lump In Breast     Review of Systems (Angelica Smith RN; 06/18/2016 7:52 AM) General Not Present- Appetite Loss, Chills, Fatigue, Fever, Night Sweats, Weight Gain and Weight Loss. Skin Not Present- Change in Wart/Mole, Dryness, Hives, Jaundice, New Lesions, Non-Healing Wounds, Rash and Ulcer. HEENT Present- Wears glasses/contact lenses. Not Present- Earache, Hearing Loss, Hoarseness, Nose Bleed, Oral Ulcers, Ringing in Angelica Ears, Seasonal Allergies, Sinus Pain, Sore  Throat, Visual Disturbances and Yellow Eyes. Respiratory Present- Snoring. Not Present- Bloody sputum, Chronic Cough, Difficulty Breathing and Wheezing. Breast Present- Breast Mass. Not Present- Breast Pain, Nipple Discharge and Skin Changes. Cardiovascular Present- Leg Cramps. Not Present- Chest Pain, Difficulty Breathing Lying Down, Palpitations, Rapid Heart Rate, Shortness of Breath and Swelling of Extremities. Gastrointestinal Not Present- Abdominal Pain, Bloating, Bloody Stool, Change in Bowel Habits, Chronic diarrhea, Constipation, Difficulty Swallowing, Excessive gas, Gets full quickly at meals, Hemorrhoids, Indigestion, Nausea, Rectal Pain and Vomiting. Female Genitourinary Present- Frequency. Not Present- Nocturia, Painful Urination, Pelvic Pain and Urgency. Musculoskeletal Not Present- Back Pain, Joint Pain, Joint Stiffness, Muscle Pain, Muscle Weakness and Swelling of Extremities. Neurological Not Present- Decreased Memory, Fainting, Headaches, Numbness, Seizures, Tingling, Tremor, Trouble walking and Weakness. Psychiatric Present- Anxiety. Not Present- Bipolar, Change in Sleep Pattern, Depression, Fearful and Frequent crying. Endocrine Not Present- Cold Intolerance, Excessive Hunger, Hair Changes, Heat Intolerance, Hot flashes and New Diabetes. Hematology Present- Blood Thinners. Not Present- Easy Bruising, Excessive bleeding, Gland problems, HIV and Persistent Infections.   Physical Exam (Fronie Holstein A. Climmie Buelow MD; 06/18/2016 2:44 PM)  General Mental Status-Alert. General Appearance-Consistent with stated age. Hydration-Well hydrated. Voice-Normal.  Head and Neck Head-normocephalic, atraumatic with no lesions or palpable masses. Trachea-midline. Thyroid Gland Characteristics - normal size and consistency.  Eye Eyeball - Bilateral-Extraocular movements intact. Sclera/Conjunctiva - Bilateral-No scleral icterus.  Chest and Lung Exam Chest and lung exam reveals  -quiet, even and easy respiratory effort with no use of accessory muscles and on auscultation, normal breath sounds, no adventitious sounds and normal vocal resonance. Inspection Chest Wall - Normal. Back - normal.  Breast Breast - Left-Symmetric, Non Tender, No Biopsy scars, no Dimpling, No Inflammation, No Lumpectomy scars, No Mastectomy scars, No Peau d' Orange. Breast - Right-Symmetric, Non Tender, No Biopsy scars, no Dimpling, No Inflammation, No Lumpectomy scars, No Mastectomy scars, No Peau d' Orange. Breast Lump-No Palpable Breast Mass. Note: bruising left axilla  Cardiovascular Cardiovascular examination reveals -normal heart sounds, regular rate and rhythm with no murmurs and normal pedal pulses bilaterally.  Abdomen Inspection   Inspection of Angelica abdomen reveals - No Hernias. Skin - Scar - no surgical scars. Palpation/Percussion Palpation and Percussion of Angelica abdomen reveal - Soft, Non Tender, No Rebound tenderness, No Rigidity (guarding) and No hepatosplenomegaly. Auscultation Auscultation of Angelica abdomen reveals - Bowel sounds normal.  Neurologic Neurologic evaluation reveals -alert and oriented x 3 with no impairment of recent or remote memory. Mental Status-Normal.  Musculoskeletal Normal Exam - Left-Upper Extremity Strength Normal and Lower Extremity Strength Normal. Normal Exam - Right-Upper Extremity Strength Normal and Lower Extremity Strength Normal.  Lymphatic Head & Neck  General Head & Neck Lymphatics: Bilateral - Description - Normal. Axillary  General Axillary Region: Bilateral - Description - Normal. Tenderness - Non Tender. Femoral & Inguinal - Did not examine.    Assessment & Plan (Freya Zobrist A. Irva Loser MD; 06/18/2016 2:47 PM)  BREAST NEOPLASM, TIS (DCIS), LEFT (D05.12) Impression: discussed COMET trial and surgical options. Trial navigator will discuss COMET. If she is not a candidate, left breast partial mastectomy and SLN mapping  discussed. Wants to conserve her breast. Risk of lumpectomy include bleeding, infection, seroma, more surgery, use of seed/wire, wound care, cosmetic deformity and Angelica need for other treatments, death , blood clots, death. Pt agrees to proceed. Risk of sentinel lymph node mapping include bleeding, infection, lymphedema, shoulder pain. stiffness, dye allergy. cosmetic deformity , blood clots, death, need for more surgery. Pt agres to proceed.  Current Plans You are being scheduled for surgery- Our schedulers will call you.  You should hear from our office's scheduling department within 5 working days about Angelica location, date, and time of surgery. We try to make accommodations for patient's preferences in scheduling surgery, but sometimes Angelica OR schedule or Angelica surgeon's schedule prevents us from making those accommodations.  If you have not heard from our office ([phone]) in 5 working days, call Angelica office and ask for your surgeon's nurse.  If you have other questions about your diagnosis, plan, or surgery, call Angelica office and ask for your surgeon's nurse.  Pt Education - [facility] Breast Cancer Information Given - [facility] "Breast Journey" Package We discussed Angelica staging and pathophysiology of breast cancer. We discussed all of Angelica different options for treatment for breast cancer including surgery, chemotherapy, radiation therapy, Herceptin, and antiestrogen therapy. We discussed a sentinel lymph node biopsy as she does not appear to having lymph node involvement right now. We discussed Angelica performance of that with injection of radioactive tracer and blue dye. We discussed that she would have an incision underneath her axillary hairline. We discussed that there is a bout a 10-20% chance of having a positive node with a sentinel lymph node biopsy and we will await Angelica permanent pathology to make any other first further decisions in terms of her treatment. One of these options might be to return to Angelica  operating room to perform an axillary lymph node dissection. We discussed about a 1-2% risk lifetime of chronic shoulder pain as well as lymphedema associated with a sentinel lymph node biopsy. We discussed Angelica options for treatment of Angelica breast cancer which included lumpectomy versus a mastectomy. We discussed Angelica performance of Angelica lumpectomy with a wire placement. We discussed a 10-20% chance of a positive margin requiring reexcision in Angelica operating room. We also discussed that she may need radiation therapy or antiestrogen therapy or both if she undergoes lumpectomy. We discussed Angelica mastectomy and Angelica postoperative care for that as well. We discussed that there is no difference in her survival whether   she undergoes lumpectomy with radiation therapy or antiestrogen therapy versus a mastectomy. There is a slight difference in Angelica local recurrence rate being 3-5% with lumpectomy and about 1% with a mastectomy. We discussed Angelica risks of operation including bleeding, infection, possible reoperation. She understands her further therapy will be based on what her stages at Angelica time of her operation.  Pt Education - [facility] breast cancer surgery: discussed with patient and provided information. Pt Education - [facility] Breast Biopsy HCI: discussed with patient and provided information. Pt Education - [facility] (After Breast Cancer) Class Info: discussed with patient and provided information. 

## 2016-06-18 NOTE — Progress Notes (Signed)
[place]  [phone] Fax:([phone]     ID: Angelica West DOB: April 08, 1950  MR#: [id number]  JYN#:[id number]  Patient Care Team: Leighton Ruff, MD as PCP - General (Family Medicine) Lin Landsman, MD (Family Medicine) Erroll Luna, MD as Consulting Physician (General Surgery) Chauncey Cruel, MD as Consulting Physician (Oncology) Eppie Gibson, MD as Attending Physician (Radiation Oncology) Chauncey Cruel, MD OTHER MD:  CHIEF COMPLAINT: Ductal carcinoma in situ  CURRENT TREATMENT: Awaiting definitive surgery   BREAST CANCER HISTORY: The patient had screening bilateral mammography showing an area of pleomorphic calcifications in the left breast upper outer quadrant. Diagnostic mammography of the left breast on 06/10/2016 at the [facility] showed the breast density to be category C. There was an area of grouped punctate and amorphus calcifications in the upper outer quadrant measuring up to 3.5 cm. 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  Her subsequent history is as detailed below  INTERVAL HISTORY: Angelica West was evaluated in the multidisciplinary breast cancer clinic 06/18/2016 accompanied by her husband Angelica West and her sister-in-law. Her case was also presented in the multidisciplinary breast cancer conference that same morning. At that time a preliminary plan was proposed: Consideration of the COMET trial, otherwise lumpectomy, radiation, and consideration of anti-estrogens. The patient qualifies for genetics testing  REVIEW OF SYSTEMS: There were no specific symptoms leading to the original mammogram, which was routinely scheduled. The patient denies unusual headaches, visual changes, nausea, vomiting, stiff neck, dizziness, or gait imbalance. There has been no cough, phlegm production, or pleurisy,  no chest pain or pressure, and no change in bowel or bladder habits. The patient denies fever, rash, bleeding, unexplained fatigue or unexplained weight loss. She exercises regularly at the gym A detailed review of systems was otherwise entirely negative.   PAST MEDICAL HISTORY: Past Medical History:  Diagnosis Date  . Anxiety   . Hypertension     PAST SURGICAL HISTORY: Past Surgical History:  Procedure Laterality Date  . BILATERAL SALPINGONEOSTOMY, LYSIS OF ADHESIONS, R OV CYSTECTOMY      FAMILY HISTORY Family History  Problem Relation Age of Onset  . Hypertension Mother   . Osteoporosis Mother   . Stroke Mother     RECENTLY DIED  . Hypertension Father   . Diabetes Father   . Heart disease Father   . Cancer Father     PROSTATE  . Hypertension Sister   . Hypertension Brother   . Cancer Brother     PROSTATE  The patient's father was diagnosed with prostate cancer age 74. He died at age 10. The patient's mother died from not cancer related causes at age 60. The patient has a brother who was diagnosed with prostate cancer at age 21. She also has a sister. There is no history of ovarian cancer in the family.  GYNECOLOGIC HISTORY:  Patient's last menstrual period was 03/10/1997. Menarche age 33. The patient is GX P0. She does not recall exactly when she went through menopause. She did not take hormone replacement. She did use oral contraceptives approximately 5 years remotely with no side effects that she is aware of  SOCIAL HISTORY:  The patient is a Clinical cytogeneticist. At home it's just she and her husband Angelica West    ADVANCED DIRECTIVES: Not in place   HEALTH MAINTENANCE: Social History  Substance Use Topics  . Smoking status: Never Smoker  .  Smokeless tobacco: Never Used  . Alcohol use No     Colonoscopy: 2017  PAP:  Bone density: Never   No Known Allergies  Current Outpatient Prescriptions  Medication Sig Dispense Refill  . Ascorbic Acid (VITAMIN C) 100 MG  tablet Take 100 mg by mouth daily.    Marland Kitchen aspirin 81 MG tablet Take 81 mg by mouth daily.      . Calcium Carbonate-Vitamin D (CALCIUM + D PO) Take by mouth.      . Cholecalciferol (VITAMIN D PO) Take by mouth.      . fish oil-omega-3 fatty acids 1000 MG capsule Take 2 g by mouth daily.    Marland Kitchen GARLIC PO Take by mouth.      Marland Kitchen GLUCOSAMINE PO Take by mouth.      . hydrochlorothiazide (HYDRODIURIL) 25 MG tablet Take 25 mg by mouth daily.    Marland Kitchen lisinopril (PRINIVIL,ZESTRIL) 10 MG tablet Take 10 mg by mouth daily.    . Multiple Vitamin (MULTIVITAMIN) capsule Take 1 capsule by mouth daily.      Marland Kitchen zinc gluconate 50 MG tablet Take 50 mg by mouth daily.     No current facility-administered medications for this visit.     OBJECTIVE: Middle-aged African-American woman in no acute distress  Vitals:   06/18/16 1259  BP: 130/67  Pulse: 69  Resp: 18  Temp: 98.2 F (36.8 C)     Body mass index is 24 kg/m.    ECOG FS:0 - Asymptomatic  Ocular: Sclerae unicteric, pupils equal, round and reactive to light Ear-nose-throat: Oropharynx clear and moist Lymphatic: No cervical or supraclavicular adenopathy Lungs no rales or rhonchi, good excursion bilaterally Heart regular rate and rhythm, no murmur appreciated Abd soft, nontender, positive bowel sounds MSK no focal spinal tenderness, no joint edema Neuro: non-focal, well-oriented, appropriate affect Breasts: I do not palpate a mass in either breast. Both axillae are benign.   LAB RESULTS:  CMP     Component Value Date/Time   NA 141 06/18/2016 1234   K 3.9 06/18/2016 1234   CL 100 02/25/2013 0846   CO2 28 06/18/2016 1234   GLUCOSE 89 06/18/2016 1234   BUN 16.6 06/18/2016 1234   CREATININE 1.0 06/18/2016 1234   CALCIUM 9.8 06/18/2016 1234   PROT 7.4 06/18/2016 1234   ALBUMIN 3.9 06/18/2016 1234   AST 22 06/18/2016 1234   ALT 22 06/18/2016 1234   ALKPHOS 80 06/18/2016 1234   BILITOT 0.43 06/18/2016 1234    No results found for: TOTALPROTELP,  ALBUMINELP, A1GS, A2GS, BETS, BETA2SER, GAMS, MSPIKE, SPEI  No results found for: Nils Pyle, [facility]  Lab Results  Component Value Date   WBC 5.1 06/18/2016   NEUTROABS 2.7 06/18/2016   HGB 12.5 06/18/2016   HCT 37.4 06/18/2016   MCV 88.4 06/18/2016   PLT 218 06/18/2016      Chemistry      Component Value Date/Time   NA 141 06/18/2016 1234   K 3.9 06/18/2016 1234   CL 100 02/25/2013 0846   CO2 28 06/18/2016 1234   BUN 16.6 06/18/2016 1234   CREATININE 1.0 06/18/2016 1234      Component Value Date/Time   CALCIUM 9.8 06/18/2016 1234   ALKPHOS 80 06/18/2016 1234   AST 22 06/18/2016 1234   ALT 22 06/18/2016 1234   BILITOT 0.43 06/18/2016 1234       No results found for: LABCA2  No components found for: [id number]  No results for input(s): INR in the  last 168 hours.  Urinalysis    Component Value Date/Time   COLORURINE YELLOW 02/25/2013 0846   APPEARANCEUR CLEAR 02/25/2013 0846   LABSPEC 1.006 02/25/2013 0846   PHURINE 6.5 02/25/2013 0846   GLUCOSEU NEG 02/25/2013 0846   HGBUR NEG 02/25/2013 0846   BILIRUBINUR NEG 02/25/2013 0846   KETONESUR NEG 02/25/2013 0846   PROTEINUR NEG 02/25/2013 0846   UROBILINOGEN 0.2 02/25/2013 0846   NITRITE NEG 02/25/2013 0846   LEUKOCYTESUR NEG 02/25/2013 0846     STUDIES: Mm Digital Diagnostic Unilat L  Addendum Date: 06/10/2016   ADDENDUM REPORT: 06/10/2016 14:37 ADDENDUM: Patient presented 06/10/2016 for a stereotactic guided core biopsy of left breast calcifications. I met with the patient we discussed the procedure of stereotactic guided core biopsy. The patient was positioned for the biopsy and calcifications were identified on stereotactic images. Using sterile technique, 1 percent lidocaine was used for local anesthetic. A dermatotomy was made and the needle was placed in preparation for the biopsy. However, the patient had a vasovagal reaction and the needle was removed without tissue sampling. The patient  had initial blood pressure of 80's/30's, with pulse in the lower 50s. Patient was supported with oxygen and Trendelenburg position until blood pressure returned to 109/65 and pulse of 64. She was observed for another 30-45 minutes was able to eat and drink without difficulty. Patient was discharged home. Procedure has been rescheduled for stereotactic biopsy on 06/11/2016. Electronically Signed   By: [name] M.D.   On: 06/10/2016 14:37   Result Date: 06/10/2016 CLINICAL DATA:  Patient returns today to evaluate left breast calcifications identified on recent screening mammogram. EXAM: DIGITAL DIAGNOSTIC LEFT MAMMOGRAM COMPARISON:  Previous exam(s). ACR Breast Density Category c: The breast tissue is heterogeneously dense, which may obscure small masses. FINDINGS: Grouped punctate and amorphous calcifications are confirmed within the upper-outer quadrant of the left breast, at posterior depth, with a suspicious linear distribution spanning approximately 3.5 cm. IMPRESSION: Suspicious punctate and amorphous calcifications within the upper-outer quadrant of the left breast, at posterior depth, for which stereotactic biopsy is recommended. RECOMMENDATION: Stereotactic biopsy for the left breast calcifications. Stereotactic biopsy is scheduled for April 3rd at 11:30 a.m. I have discussed the findings and recommendations with the patient. Results were also provided in writing at the conclusion of the visit. If applicable, a reminder letter will be sent to the patient regarding the next appointment. BI-RADS CATEGORY  4: Suspicious. Electronically Signed: By: [name] M.D. On: 06/09/2016 16:46   Mm Clip Placement Left  Result Date: 06/11/2016 CLINICAL DATA:  Status post stereotactic guided core biopsy of left breast calcifications. EXAM: DIAGNOSTIC LEFT MAMMOGRAM POST STEREOTACTIC BIOPSY COMPARISON:  Prior studies FINDINGS: Mammographic images were obtained following stereotactic guided biopsy of  calcifications the posterior upper outer quadrant of the left breast. A coil shaped tissue marker clip is identified in the upper-outer quadrant based on true lateral and MLO projections. The clip is very far posterior and lateral and is not visible on the exaggerated craniocaudal projection. IMPRESSION: Tissue marker clip in the expected location following biopsy. Final Assessment: Post Procedure Mammograms for Marker Placement Electronically Signed   By: [name] M.D.   On: 06/11/2016 12:26   Mm Lt Breast Bx W Loc Dev 1st Lesion Image Bx Spec Stereo Guide  Addendum Date: 06/13/2016   ADDENDUM REPORT: 06/13/2016 14:27 ADDENDUM: Pathology revealed INTERMEDIATE GRADE DUCTAL CARCINOMA IN SITU WITH CALCIFICATIONS AND NECROSIS of the Left breast, upper outer quadrant. This was found to  be concordant by Dr. [name]. Pathology results were discussed with the patient by telephone. The patient reported doing well after the biopsy with tenderness at the site. Post biopsy instructions and care were reviewed and questions were answered. The patient was encouraged to call [facility] for any additional concerns. The patient was referred to [facility] on June 18, 2016. Pathology results reported by Terie Purser, RN on 06/13/2016. Electronically Signed   By: [name] M.D.   On: 06/13/2016 14:27   Result Date: 06/13/2016 CLINICAL DATA:  Patient presents for stereotactic guided core biopsy of left breast calcifications. EXAM: LEFT BREAST STEREOTACTIC CORE NEEDLE BIOPSY COMPARISON:  Previous exams. FINDINGS: The patient and I discussed the procedure of stereotactic-guided biopsy including benefits and alternatives. We discussed the high likelihood of a successful procedure. We discussed the risks of the procedure including infection, bleeding, tissue injury, clip migration, and inadequate sampling.  Informed written consent was given. The usual time out protocol was performed immediately prior to the procedure. Using sterile technique and 1% Lidocaine as local anesthetic, under stereotactic guidance, a 9 gauge vacuum assisted device was used to perform core needle biopsy of calcifications in the posterior upper outer quadrant of the left breast using a lateral approach. Specimen radiograph was performed showing calcifications to be present. Specimens with calcifications are identified for pathology. At the conclusion of the procedure, a coil shaped tissue marker clip was deployed into the biopsy cavity. Follow-up 2-view mammogram was performed and dictated separately. IMPRESSION: Stereotactic-guided biopsy of left breast calcifications. No apparent complications. Electronically Signed: By: [name] M.D. On: 06/11/2016 12:25    ELIGIBLE FOR AVAILABLE RESEARCH PROTOCOL: Not in candidate for the COMET trial because of necrosis   ASSESSMENT: 66 y.o. [place] woman status post left breast upper outer quadrant biopsy 06/11/2016 showing ductal carcinoma in situ, grade 2, estrogen and progesterone receptor positive  (1) genetics testing pending  (2) breast conserving surgery planned  (3) adjuvant radiation to follow  (4) consider anti-estrogens at the completion of local treatment  PLAN: We spent the better part of today's hour-long appointment discussing the biology of breast cancer in general, and the specifics of the patient's tumor in particular. We discussed the COMET trial in detail. The patient was very motivated to participate and was very disappointed to learn that her cancer did not meet the enrollment criteria.   Ashawna understands that in noninvasive ductal carcinoma, also called ductal carcinoma in situ ("DCIS") the breast cancer cells remain trapped in the ducts were they started. They cannot travel to a vital organ. For that reason these cancers in themselves are not  life-threatening.  If the whole breast is removed then all the ducts are removed and since the cancer cells are trapped in the ducts, the cure rate with mastectomy for noninvasive breast cancer is approximately 99%. Nevertheless we recommend lumpectomy, because there is no survival advantage to mastectomy and because the cosmetic result is generally superior with breast conservation.  Since the patient is keeping her breast, there will be some risk of recurrence. The recurrence can only be in the same breast since, again, the cells are trapped in the ducts. There is no connection from one breast to the other. The risk of local recurrence is cut by more than half with radiation, which is standard in this situation.  In estrogen receptor positive cancers like Jasilyn's, anti-estrogens can also be considered.  They will further reduce the risk of recurrence by one half. In addition anti-estrogens will lower the risk of a new breast cancer developing in either breast, also by one half. That risk approaches 1% per year. Anti-estrogens reduce it to 1/2%.  L[place]a does qualify for genetics testing. In patients who carry a deleterious mutation [for example in a  BRCA gene], the risk of a new breast cancer developing in the future may be sufficiently great that the patient may choose bilateral mastectomies. However if she wishes to keep her breasts in that situation it is safe to do so. That would require intensified screening, which generally means not only yearly mammography but a yearly breast MRI as well. Of course, if there is a deleterious mutation bilateral oophorectomy would be necessary as there is no standard screening protocol for ovarian cancer. At this point [place] wishes to proceed directly to surgery.   Sibel has a good understanding of the overall plan, which is lumpectomy with sentinel lymph node sampling followed by adjuvant radiation. She knows the goal of treatment in her case is cure. She will  call with any problems that may develop before her next visit with me, which will be in approximately 2 months, at which point we will discuss anti-estrogens for breast cancer treatment and prevention.  Chauncey Cruel, MD   06/18/2016 7:00 PM Medical Oncology and Hematology [facility] [address] [place], [place] [address] Tel. [phone]    Fax. [phone]

## 2016-06-18 NOTE — Therapy (Signed)
[place], [place], [address] Phone: [phone]   Fax:  [phone]  Physical Therapy Evaluation  Patient Details  Name: Angelica West MRN: [id number] Date of Birth: 1950/08/06 Referring Provider: Dr. Erroll Luna  Encounter Date: 06/18/2016      PT End of Session - 06/18/16 1609    Visit Number 1   Number of Visits 1   PT Start Time 1503   PT Stop Time 1532   PT Time Calculation (min) 29 min   Activity Tolerance Patient tolerated treatment well   Behavior During Therapy [facility] for tasks assessed/performed      Past Medical History:  Diagnosis Date  . Anxiety   . Hypertension     Past Surgical History:  Procedure Laterality Date  . BILATERAL SALPINGONEOSTOMY, LYSIS OF ADHESIONS, R OV CYSTECTOMY      There were no vitals filed for this visit.       Subjective Assessment - 06/18/16 1604    Subjective Patient reports she is here to be seen by her medical team for her newly diagnosed left breast cancer.   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 It measures 9 mm and is located in the upper inner quadrant. She was recently diagnosed with Type II diabetes.   Patient Stated Goals reduce lymphedema risk and elarn post op shoulder ROM HEP   Currently in Pain? No/denies            [facility] PT Assessment - 06/18/16 0001      Assessment   Medical Diagnosis Left breast cancer   Referring Provider Dr. [name] [place]   Onset Date/Surgical Date 06/02/16   Hand Dominance Right   Prior Therapy none     Precautions   Precautions Other (comment)   Precaution Comments active breast cancer     Restrictions   Weight Bearing Restrictions No     Balance Screen   Has the patient fallen in the past 6 months No   Has the patient had a decrease in activity level because of a fear of falling?  No   Is  the patient reluctant to leave their home because of a fear of falling?  No     Home Ecologist residence   Living Arrangements Spouse/significant other   Available Help at Discharge Family     Prior Function   Level of Independence Independent   Vocation Full time employment   Vocation Requirements Tax appraiser   Leisure She goes to the gym 5x/week doing cardio, weights, and exercise classes     Cognition   Overall Cognitive Status Within Functional Limits for tasks assessed     Posture/Postural Control   Posture/Postural Control Postural limitations   Postural Limitations Rounded Shoulders;Forward head     ROM / Strength   AROM / PROM / Strength AROM;Strength     AROM   AROM Assessment Site Shoulder;Cervical   Right/Left Shoulder Right;Left   Right Shoulder Extension 60 Degrees   Right Shoulder Flexion 160 Degrees   Right Shoulder ABduction 160 Degrees   Right Shoulder Internal Rotation 80 Degrees   Right Shoulder External Rotation 70 Degrees   Left Shoulder Extension 42 Degrees   Left Shoulder Flexion 142 Degrees   Left Shoulder ABduction 143 Degrees   Left Shoulder Internal Rotation 71 Degrees   Left Shoulder External Rotation 84 Degrees  Cervical Flexion WNL   Cervical Extension WNL   Cervical - Right Side Bend 25% limited   Cervical - Left Side Bend 25% limited   Cervical - Right Rotation 25% limited   Cervical - Left Rotation 25% limited     Strength   Overall Strength Within functional limits for tasks performed           LYMPHEDEMA/ONCOLOGY QUESTIONNAIRE - 06/18/16 1607      Type   Cancer Type Left breast cancer     Lymphedema Assessments   Lymphedema Assessments Upper extremities     Right Upper Extremity Lymphedema   10 cm Proximal to Olecranon Process 27.1 cm   Olecranon Process 23.7 cm   10 cm Proximal to Ulnar Styloid Process 21.1 cm   Just Proximal to Ulnar Styloid Process 15.6 cm   Across Hand at [employer] 20.6 cm   At [place] of 2nd Digit 6.2 cm     Left Upper Extremity Lymphedema   10 cm Proximal to Olecranon Process 27.8 cm   Olecranon Process 24 cm   10 cm Proximal to Ulnar Styloid Process 19.5 cm   Just Proximal to Ulnar Styloid Process 15.2 cm   Across Hand at [employer] 19.7 cm   At [place] of 2nd Digit 5.9 cm       Patient was instructed today in a home exercise program today for post op shoulder range of motion. These included active assist shoulder flexion in sitting, scapular retraction, wall walking with shoulder abduction, and hands behind head external rotation.  She was encouraged to do these twice a day, holding 3 seconds and repeating 5 times when permitted by her physician.          PT Education - 06/18/16 1608    Education provided Yes   Education Details Lymphedema risk reduction and post op shoulder ROM HEP   Person(s) Educated Patient;Spouse   Methods Explanation;Demonstration;Handout   Comprehension Verbalized understanding;Returned demonstration              Breast Clinic Goals - 06/18/16 1611      Patient will be able to verbalize understanding of pertinent lymphedema risk reduction practices relevant to her diagnosis specifically related to skin care.   Time 1   Period Days   Status Achieved     Patient will be able to return demonstrate and/or verbalize understanding of the post-op home exercise program related to regaining shoulder range of motion.   Time 1   Period Days   Status Achieved     Patient will be able to verbalize understanding of the importance of attending the postoperative After Breast Cancer Class for further lymphedema risk reduction education and therapeutic exercise.   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 It measures 9 mm and is located in the upper inner quadrant.  She was recently diagnosed with Type II diabetes. Her multidisciplinary medical team met prior to her assessments to determine a recommended treatment plan. She is planning to have a left lumpectomy, sentinel node biopsy, radiation, and anti-estrogen therapy. She may benefit from post op PT to regain shoulder ROM and strength to return to full function and her baseline exercise routine. Due to her lack of comorbidities, her eval is of low complexity.  Rehab Potential Excellent   Clinical Impairments Affecting Rehab Potential none   PT Frequency One time visit   PT Treatment/Interventions Patient/family education;Therapeutic exercise   PT Next Visit Plan Will f/u after surgery to determine PT needs   PT Home Exercise Plan Post op shoulder ROM HEP   Consulted and Agree with Plan of Care Patient;Family member/caregiver   Family Member Consulted sister and husband      Patient will benefit from skilled therapeutic intervention in order to improve the following deficits and impairments:  Postural dysfunction, Decreased knowledge of precautions, Pain, Impaired UE functional use, Decreased range of motion  Visit Diagnosis: Carcinoma of upper-outer quadrant of left breast in female, estrogen receptor positive ([place]) - Plan: PT plan of care cert/re-cert  Abnormal posture - Plan: PT plan of care cert/re-cert   Patient will follow up at outpatient cancer rehab if needed following surgery.  If the patient requires physical therapy at that time, a specific plan will be dictated and sent to the referring physician for approval. The patient was educated today on appropriate basic range of motion exercises to begin post operatively and the importance of attending the After Breast Cancer class following surgery.  Patient was educated today on lymphedema risk reduction practices as it pertains to recommendations that will benefit the patient immediately following surgery.  She verbalized good understanding.  No  additional physical therapy is indicated at this time.      Problem List Patient Active Problem List   Diagnosis Date Noted  . Malignant neoplasm of upper-outer quadrant of left breast in female, estrogen receptor positive ([facility]) 06/17/2016  . Hypertension 02/20/2012   Annia Friendly, PT 06/18/16 4:13 PM  [place], [place], [address] Phone: [phone]   Fax:  [phone]  Name: DHRITI FALES MRN: [id number] Date of Birth: 09-13-1950

## 2016-06-18 NOTE — Progress Notes (Unsigned)
Nutrition Assessment  Reason for Assessment:  Pt seen in Breast Clinic  ASSESSMENT:   66 year old female with new diagnosis of left breast cancer.  Past medical history reviewed.   Medications:  reviewed  Labs: reviewed  Anthropometrics:   Height: 66 inches Weight: 148 lb 11.2 oz BMI: 24.1   NUTRITION DIAGNOSIS: Food and nutrition related knowledge deficit related to new diagnosis of breast cancer as evidenced by no prior need for nutrition related information.  INTERVENTION:   Discussed and provided packet of information regarding nutritional tips for breast cancer patients.  Questions answered.  Teachback method used.  Contact information provided and patient knows to contact me with questions/concerns.    MONITORING, EVALUATION, and GOAL: Pt will consume a healthy plant based diet to maintain lean body mass throughout treatment.   Angelica West, Angelica West, Angelica West Registered Dietitian [phone] (pager)

## 2016-06-18 NOTE — Progress Notes (Signed)
Radiation Oncology         [phone] ________________________________  Initial outpatient Consultation  Name: Angelica West MRN: [id number]  Date: 06/18/2016  DOB: Jul 17, 1950  OM:BTDHRC,BULAGTXMI STEWART, MD  Erroll Luna, MD   REFERRING PHYSICIAN: Erroll Luna, MD  DIAGNOSIS:    ICD-9-CM ICD-10-CM   1. 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 female who presented with area of pleomorphic calcifications in the left breast on screening mammogram. The area of calcifications spanned 3.5 cm in the upper-outer quadrant. 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 Axillary ultrasound was not performed. She does have a significant family history and will be referred to Genetics.   The patient presents to the clinic today to discuss the role that radiation therapy may play in the treatment of her disease. She is accompanied today by her husband and sister in law.  On review of systems, the patient is positive for glasses, dentures, loss of sleep, difficulty walking, and arthritis.  Of note, the patient is postmenopausal. She has never used hormone replacement. She used hormonal birth control from [phone]. Her last colonoscopy was in 2017. She has never had a bone density scan. Her last pap smear was 05/2016.  PREVIOUS RADIATION THERAPY: No  PAST MEDICAL HISTORY:  has a past medical history of  Anxiety and Hypertension.    PAST SURGICAL HISTORY: Past Surgical History:  Procedure Laterality Date  . BILATERAL SALPINGONEOSTOMY, LYSIS OF ADHESIONS, R OV CYSTECTOMY      FAMILY HISTORY: family history includes Cancer in her brother and father; Diabetes in her father; Heart disease in her father; Hypertension in her brother, father, mother, and sister; Osteoporosis in her mother; Stroke in her mother.  SOCIAL HISTORY:  reports that she has never smoked. She has never used smokeless tobacco. She reports that she does not drink alcohol or use drugs.  ALLERGIES: Patient has no known allergies.  MEDICATIONS:  Current Outpatient Prescriptions  Medication Sig Dispense Refill  . Ascorbic Acid (VITAMIN C) 100 MG tablet Take 100 mg by mouth daily.    Marland Kitchen aspirin 81 MG tablet Take 81 mg by mouth daily.      . Calcium Carbonate-Vitamin D (CALCIUM + D PO) Take by mouth.      . Cholecalciferol (VITAMIN D PO) Take by mouth.      . fish oil-omega-3 fatty acids 1000 MG capsule Take 2 g by mouth daily.    Marland Kitchen GARLIC PO Take by mouth.      Marland Kitchen GLUCOSAMINE PO Take by mouth.      . hydrochlorothiazide (HYDRODIURIL) 25 MG tablet Take 25 mg by mouth daily.    Marland Kitchen lisinopril (PRINIVIL,ZESTRIL) 10 MG tablet Take 10 mg by mouth daily.    . Multiple Vitamin (MULTIVITAMIN) capsule Take 1 capsule by mouth daily.      Marland Kitchen zinc gluconate 50 MG tablet Take 50 mg by mouth daily.     No current  facility-administered medications for this encounter.     REVIEW OF SYSTEMS: A 10+ POINT REVIEW OF SYSTEMS WAS OBTAINED including neurology, dermatology, psychiatry, cardiac, respiratory, lymph, extremities, GI, GU, Musculoskeletal, constitutional, breasts, reproductive, HEENT.  All pertinent positives are noted in the HPI.  All others are negative.   PHYSICAL EXAM:   Vitals with BMI 06/18/2016  Height 5' 6"   Weight 148 lbs 11 oz  BMI 60.1  Systolic 561  Diastolic 67  Pulse 69  Respirations 18  General: Alert and oriented,  in no acute distress. HEENT: Oropharynx and oral cavity are clear. Neck: Neck is supple, no palpable cervical or supraclavicular lymphadenopathy. Heart: Regular in rate and rhythm with no murmurs. Chest: Clear to auscultation bilaterally. Abdomen: Soft, non tender, non distended. Extremities: No edema. Lymphatics: see Neck Exam. Skin: No concerning lesions. Neurologic: EOMI.  No obvious focalities. Speech is fluent. Coordination is intact. Psychiatric: Judgment and insight are intact. Affect is appropriate. Breasts: I do not feel a mass in either breast. I feel a tiny amount of swelling where the biopsy was done in the upper-outer quadrant, as well as dense tissue in the 12 o'clock regions of both breasts, which is symmetrical, and does not seem  suspicious for malignancy.   ECOG = 0   LABORATORY DATA:  Lab Results  Component Value Date   WBC 5.1 06/18/2016   HGB 12.5 06/18/2016   HCT 37.4 06/18/2016   MCV 88.4 06/18/2016   PLT 218 06/18/2016   CMP     Component Value Date/Time   NA 141 06/18/2016 1234   K 3.9 06/18/2016 1234   CL 100 02/25/2013 0846   CO2 28 06/18/2016 1234   GLUCOSE 89 06/18/2016 1234   BUN 16.6 06/18/2016 1234   CREATININE 1.0 06/18/2016 1234   CALCIUM 9.8 06/18/2016 1234   PROT 7.4 06/18/2016 1234   ALBUMIN 3.9 06/18/2016 1234   AST 22 06/18/2016 1234   ALT 22 06/18/2016 1234   ALKPHOS 80 06/18/2016 1234   BILITOT 0.43 06/18/2016 1234      RADIOGRAPHY: Mm Digital Diagnostic Unilat L  Addendum Date: 06/10/2016   ADDENDUM REPORT: 06/10/2016 14:37 ADDENDUM: Patient presented 06/10/2016 for a stereotactic guided core biopsy of left breast calcifications. I met with the patient we discussed the procedure of stereotactic guided core biopsy. The patient was positioned for the biopsy and calcifications were identified on stereotactic images. Using sterile technique, 1 percent lidocaine was used for local anesthetic. A dermatotomy was made and the needle was  placed in preparation for the biopsy. However, the patient had a vasovagal reaction and the needle was removed without tissue sampling. The patient had initial blood pressure of 80's/30's, with pulse in the lower 50s. Patient was supported with oxygen and Trendelenburg position until blood pressure returned to 109/65 and pulse of 64. She was observed for another 30-45 minutes was able to eat and drink without difficulty. Patient was discharged home. Procedure has been rescheduled for stereotactic biopsy on 06/11/2016. Electronically Signed   By: [name] M.D.   On: 06/10/2016 14:37   Result Date: 06/10/2016 CLINICAL DATA:  Patient returns today to evaluate left breast calcifications identified on recent screening mammogram. EXAM: DIGITAL DIAGNOSTIC LEFT MAMMOGRAM COMPARISON:  Previous exam(s). ACR Breast Density Category c: The breast tissue is heterogeneously dense, which may obscure small masses. FINDINGS: Grouped punctate and amorphous calcifications are confirmed within the upper-outer quadrant of the left breast, at posterior depth, with a suspicious linear distribution spanning approximately 3.5 cm.  IMPRESSION: Suspicious punctate and amorphous calcifications within the upper-outer quadrant of the left breast, at posterior depth, for which stereotactic biopsy is recommended. RECOMMENDATION: Stereotactic biopsy for the left breast calcifications. Stereotactic biopsy is scheduled for April 3rd at 11:30 a.m. I have discussed the findings and recommendations with the patient. Results were also provided in writing at the conclusion of the visit. If applicable, a reminder letter will be sent to the patient regarding the next appointment. BI-RADS CATEGORY  4: Suspicious. Electronically Signed: By: [name] M.D. On: 06/09/2016 16:46   Mm Clip Placement Left  Result Date: 06/11/2016 CLINICAL DATA:  Status post stereotactic guided core biopsy of left breast calcifications. EXAM: DIAGNOSTIC LEFT  MAMMOGRAM POST STEREOTACTIC BIOPSY COMPARISON:  Prior studies FINDINGS: Mammographic images were obtained following stereotactic guided biopsy of calcifications the posterior upper outer quadrant of the left breast. A coil shaped tissue marker clip is identified in the upper-outer quadrant based on true lateral and MLO projections. The clip is very far posterior and lateral and is not visible on the exaggerated craniocaudal projection. IMPRESSION: Tissue marker clip in the expected location following biopsy. Final Assessment: Post Procedure Mammograms for Marker Placement Electronically Signed   By: [name] M.D.   On: 06/11/2016 12:26   Mm Lt Breast Bx W Loc Dev 1st Lesion Image Bx Spec Stereo Guide  Addendum Date: 06/13/2016   ADDENDUM REPORT: 06/13/2016 14:27 ADDENDUM: Pathology revealed INTERMEDIATE GRADE DUCTAL CARCINOMA IN SITU WITH CALCIFICATIONS AND NECROSIS of the Left breast, upper outer quadrant. This was found to be concordant by Dr. [name]. Pathology results were discussed with the patient by telephone. The patient reported doing well after the biopsy with tenderness at the site. Post biopsy instructions and care were reviewed and questions were answered. The patient was encouraged to call [facility] for any additional concerns. The patient was referred to [facility] on June 18, 2016. Pathology results reported by Terie Purser, RN on 06/13/2016. Electronically Signed   By: [name] M.D.   On: 06/13/2016 14:27   Result Date: 06/13/2016 CLINICAL DATA:  Patient presents for stereotactic guided core biopsy of left breast calcifications. EXAM: LEFT BREAST STEREOTACTIC CORE NEEDLE BIOPSY COMPARISON:  Previous exams. FINDINGS: The patient and I discussed the procedure of stereotactic-guided biopsy including benefits and alternatives. We discussed the high likelihood of a  successful procedure. We discussed the risks of the procedure including infection, bleeding, tissue injury, clip migration, and inadequate sampling. Informed written consent was given. The usual time out protocol was performed immediately prior to the procedure. Using sterile technique and 1% Lidocaine as local anesthetic, under stereotactic guidance, a 9 gauge vacuum assisted device was used to perform core needle biopsy of calcifications in the posterior upper outer quadrant of the left breast using a lateral approach. Specimen radiograph was performed showing calcifications to be present. Specimens with calcifications are identified for pathology. At the conclusion of the procedure, a coil shaped tissue marker clip was deployed into the biopsy cavity. Follow-up 2-view mammogram was performed and dictated separately. IMPRESSION: Stereotactic-guided biopsy of left breast calcifications. No apparent complications. Electronically Signed: By: [name] M.D. On: 06/11/2016 12:25      IMPRESSION/PLAN:  DCIS, left breast  She has been discussed at our multidisciplinary tumor board.  The consensus is that she would be a good candidate for breast conservation if she desires surgical resection over active surveillance. I  talked to her about the option of a mastectomy (thought I didn't recommend one) and informed her that her expected overall survival would be equivalent between mastectomy and breast conservation, based upon randomized controlled data. She is enthusiastic about breast conservation.  Additionally, we reviewed the COMET trial (active surveillance, which is an option for her relatively low risk DCIS). I discussed the nature of the trial, as well as the qualifications and requirements of candidates for the trial. I encouraged the patient to ask questions, which I answered to the best of my ability.  She will meet with a research nurse to discuss the details of this trial.  It was a pleasure  meeting the patient today. We discussed the risks, benefits, and side effects of radiotherapy. If she undergoes surgery and wants to further reduce her risk of recurrence by half adjuvantly, I would recommend radiotherapy to the left breast for about 4 weeks.  I would give the patient a few weeks to heal following surgery before starting treatment planning. We spoke about acute effects including skin irritation and fatigue as well as much less common late effects including internal organ injury or irritation. We spoke about the latest technology that is used to minimize the risk of late effects for patients undergoing radiotherapy to the breast or chest wall. No guarantees of treatment were given.   The patient will consider her options. I look forward to participating in the patient's care as indicated if indicated.  I will await her referral back to me for postoperative follow-up and eventual CT simulation/treatment planning if this is the treatment plan the patient chooses.  Based on the patient's family history, she will be referred for genetic testing.   __________________________________________   Eppie Gibson, MD  This document serves as a record of services personally performed by Eppie Gibson, MD. It was created on her behalf by Maryla Morrow, a trained medical scribe. The creation of this record is based on the scribe's personal observations and the provider's statements to them. This document has been checked and approved by the attending provider.

## 2016-06-19 ENCOUNTER — Telehealth: Payer: Self-pay | Admitting: *Deleted

## 2016-06-19 ENCOUNTER — Other Ambulatory Visit: Payer: 59

## 2016-06-19 ENCOUNTER — Encounter: Payer: 59 | Admitting: Genetics

## 2016-06-19 NOTE — Telephone Encounter (Signed)
Spoke to pt concerning [place] from 4.11.18. Denies questions or concerns regarding dx or treatment care plan. Encourage pt to call with needs.

## 2016-06-20 ENCOUNTER — Encounter: Payer: Self-pay | Admitting: General Practice

## 2016-06-20 NOTE — Progress Notes (Signed)
[place] Psychosocial Distress Screening Spiritual Care  Reached Angelica West by phone following Breast Multidisciplinary Clinic to introduce [place] team/resources, reviewing distress screen per protocol.  The patient scored a 6 on the Psychosocial Distress Thermometer which indicates moderate distress. Also assessed for distress and other psychosocial needs.   ONCBCN DISTRESS SCREENING 06/20/2016  Screening Type Initial Screening  Distress experienced in past week (1-10) 6  Emotional problem type Nervousness/Anxiety;Adjusting to illness  Physical Problem type Sleep/insomnia;Tingling hands/feet  Referral to support programs Yes   Angelica West verbalized appreciation for f/u call and some relief of distress after [facility]. Faith is a very important part of her life. She has shared about her dx with some church members and requests further prayers.   Encouraged her to review full packet of [place] team/resources to learn more about programs offered.  Follow up needed: No. Per pt, no other needs at this time. Please page if immediate need arises. Thank you.   [place], [place], [facility] Pager [phone] Voicemail [phone]

## 2016-06-27 ENCOUNTER — Other Ambulatory Visit: Payer: Self-pay | Admitting: Surgery

## 2016-06-27 DIAGNOSIS — D0512 Intraductal carcinoma in situ of left breast: Secondary | ICD-10-CM

## 2016-07-01 ENCOUNTER — Encounter: Payer: Self-pay | Admitting: Radiation Oncology

## 2016-07-03 ENCOUNTER — Encounter: Payer: 59 | Admitting: Genetics

## 2016-07-03 ENCOUNTER — Encounter (HOSPITAL_BASED_OUTPATIENT_CLINIC_OR_DEPARTMENT_OTHER): Payer: Self-pay | Admitting: *Deleted

## 2016-07-03 ENCOUNTER — Other Ambulatory Visit: Payer: 59

## 2016-07-04 ENCOUNTER — Encounter (HOSPITAL_BASED_OUTPATIENT_CLINIC_OR_DEPARTMENT_OTHER)
Admission: RE | Admit: 2016-07-04 | Discharge: 2016-07-04 | Disposition: A | Discharge status: Home / Self Care | Payer: 59 | Source: Ambulatory Visit | Attending: Surgery | Admitting: Surgery

## 2016-07-04 DIAGNOSIS — F419 Anxiety disorder, unspecified: Secondary | ICD-10-CM | POA: Diagnosis not present

## 2016-07-04 DIAGNOSIS — I10 Essential (primary) hypertension: Secondary | ICD-10-CM | POA: Diagnosis not present

## 2016-07-04 DIAGNOSIS — G8929 Other chronic pain: Secondary | ICD-10-CM | POA: Diagnosis not present

## 2016-07-04 DIAGNOSIS — D0512 Intraductal carcinoma in situ of left breast: Secondary | ICD-10-CM | POA: Diagnosis not present

## 2016-07-04 DIAGNOSIS — Z79899 Other long term (current) drug therapy: Secondary | ICD-10-CM | POA: Diagnosis not present

## 2016-07-04 NOTE — Progress Notes (Signed)
Boost drink given with instructions to complete by 0600 dos, pt verbalized understanding.   No need to repeat labs (06/18/16) per Dr Josetta Huddle office.  EKG completed.

## 2016-07-14 ENCOUNTER — Other Ambulatory Visit: Payer: Self-pay | Admitting: Surgery

## 2016-07-14 ENCOUNTER — Ambulatory Visit
Admission: RE | Admit: 2016-07-14 | Discharge: 2016-07-14 | Disposition: A | Discharge status: Home / Self Care | Payer: 59 | Source: Ambulatory Visit | Attending: Surgery | Admitting: Surgery

## 2016-07-14 DIAGNOSIS — D0512 Intraductal carcinoma in situ of left breast: Secondary | ICD-10-CM

## 2016-07-16 ENCOUNTER — Ambulatory Visit (HOSPITAL_BASED_OUTPATIENT_CLINIC_OR_DEPARTMENT_OTHER): Payer: 59 | Admitting: Anesthesiology

## 2016-07-16 ENCOUNTER — Ambulatory Visit (HOSPITAL_COMMUNITY)
Admission: RE | Admit: 2016-07-16 | Discharge: 2016-07-16 | Disposition: A | Discharge status: Home / Self Care | Payer: 59 | Source: Ambulatory Visit | Attending: Surgery | Admitting: Surgery

## 2016-07-16 ENCOUNTER — Ambulatory Visit (HOSPITAL_BASED_OUTPATIENT_CLINIC_OR_DEPARTMENT_OTHER)
Admission: RE | Admit: 2016-07-16 | Discharge: 2016-07-16 | Disposition: A | Discharge status: Home / Self Care | Payer: 59 | Source: Ambulatory Visit | Attending: Surgery | Admitting: Surgery

## 2016-07-16 ENCOUNTER — Encounter (HOSPITAL_BASED_OUTPATIENT_CLINIC_OR_DEPARTMENT_OTHER)
Admission: RE | Disposition: A | Discharge status: Home / Self Care | Payer: Self-pay | Source: Ambulatory Visit | Attending: Surgery

## 2016-07-16 ENCOUNTER — Ambulatory Visit
Admission: RE | Admit: 2016-07-16 | Discharge: 2016-07-16 | Disposition: A | Discharge status: Home / Self Care | Payer: 59 | Source: Ambulatory Visit | Attending: Surgery | Admitting: Surgery

## 2016-07-16 ENCOUNTER — Encounter (HOSPITAL_BASED_OUTPATIENT_CLINIC_OR_DEPARTMENT_OTHER): Payer: Self-pay | Admitting: *Deleted

## 2016-07-16 DIAGNOSIS — F419 Anxiety disorder, unspecified: Secondary | ICD-10-CM | POA: Insufficient documentation

## 2016-07-16 DIAGNOSIS — G8929 Other chronic pain: Secondary | ICD-10-CM | POA: Insufficient documentation

## 2016-07-16 DIAGNOSIS — I10 Essential (primary) hypertension: Secondary | ICD-10-CM | POA: Insufficient documentation

## 2016-07-16 DIAGNOSIS — D0512 Intraductal carcinoma in situ of left breast: Secondary | ICD-10-CM | POA: Diagnosis not present

## 2016-07-16 DIAGNOSIS — Z79899 Other long term (current) drug therapy: Secondary | ICD-10-CM | POA: Insufficient documentation

## 2016-07-16 HISTORY — PX: BREAST LUMPECTOMY: SHX2

## 2016-07-16 HISTORY — PX: RADIOACTIVE SEED GUIDED PARTIAL MASTECTOMY WITH AXILLARY SENTINEL LYMPH NODE BIOPSY: SHX6520

## 2016-07-16 SURGERY — RADIOACTIVE SEED GUIDED PARTIAL MASTECTOMY WITH AXILLARY SENTINEL LYMPH NODE BIOPSY
Anesthesia: General | Site: Breast | Laterality: Left

## 2016-07-16 MED ORDER — HYDROCODONE-ACETAMINOPHEN 5-325 MG PO TABS: 1.0000 | ORAL_TABLET | Freq: Four times a day (QID) | ORAL | 0 refills | Status: DC | PRN

## 2016-07-16 MED ORDER — 0.9 % SODIUM CHLORIDE (POUR BTL) OPTIME
TOPICAL | Status: DC | PRN
  Administered 2016-07-16: 1000 mL

## 2016-07-16 MED ORDER — ONDANSETRON HCL 4 MG/2ML IJ SOLN
INTRAMUSCULAR | Status: DC | PRN
Start: 2016-07-16 — End: 2016-07-16
  Administered 2016-07-16: 4 mg via INTRAVENOUS

## 2016-07-16 MED ORDER — PHENYLEPHRINE 40 MCG/ML (10ML) SYRINGE FOR IV PUSH (FOR BLOOD PRESSURE SUPPORT)
PREFILLED_SYRINGE | INTRAVENOUS | Status: DC | PRN
  Administered 2016-07-16: 80 ug via INTRAVENOUS

## 2016-07-16 MED ORDER — GABAPENTIN 300 MG PO CAPS
ORAL_CAPSULE | ORAL | Status: AC
  Filled 2016-07-16: qty 1

## 2016-07-16 MED ORDER — CEFAZOLIN SODIUM-DEXTROSE 2-4 GM/100ML-% IV SOLN
INTRAVENOUS | Status: AC
  Filled 2016-07-16: qty 100

## 2016-07-16 MED ORDER — ACETAMINOPHEN 500 MG PO TABS
1000.0000 mg | ORAL_TABLET | ORAL | Status: AC
  Administered 2016-07-16: 1000 mg via ORAL

## 2016-07-16 MED ORDER — LACTATED RINGERS IV SOLN
INTRAVENOUS | Status: DC
  Administered 2016-07-16: 09:00:00 via INTRAVENOUS

## 2016-07-16 MED ORDER — FENTANYL CITRATE (PF) 100 MCG/2ML IJ SOLN
INTRAMUSCULAR | Status: AC
  Filled 2016-07-16: qty 2

## 2016-07-16 MED ORDER — MIDAZOLAM HCL 2 MG/2ML IJ SOLN
INTRAMUSCULAR | Status: AC
  Filled 2016-07-16: qty 2

## 2016-07-16 MED ORDER — ACETAMINOPHEN 500 MG PO TABS
ORAL_TABLET | ORAL | Status: AC
  Filled 2016-07-16: qty 2

## 2016-07-16 MED ORDER — CELECOXIB 400 MG PO CAPS
400.0000 mg | ORAL_CAPSULE | ORAL | Status: AC
  Administered 2016-07-16: 400 mg via ORAL

## 2016-07-16 MED ORDER — MIDAZOLAM HCL 2 MG/2ML IJ SOLN
1.0000 mg | INTRAMUSCULAR | Status: DC | PRN
  Administered 2016-07-16: 1 mg via INTRAVENOUS

## 2016-07-16 MED ORDER — FENTANYL CITRATE (PF) 100 MCG/2ML IJ SOLN
50.0000 ug | INTRAMUSCULAR | Status: DC | PRN
  Administered 2016-07-16: 100 ug via INTRAVENOUS
  Administered 2016-07-16: 50 ug via INTRAVENOUS

## 2016-07-16 MED ORDER — BUPIVACAINE-EPINEPHRINE (PF) 0.25% -1:200000 IJ SOLN: INTRAMUSCULAR | Status: DC | PRN

## 2016-07-16 MED ORDER — METHYLENE BLUE 0.5 % INJ SOLN
INTRAVENOUS | Status: AC
  Filled 2016-07-16: qty 10

## 2016-07-16 MED ORDER — SUCCINYLCHOLINE CHLORIDE 200 MG/10ML IV SOSY
PREFILLED_SYRINGE | INTRAVENOUS | Status: DC | PRN
  Administered 2016-07-16: 100 mg via INTRAVENOUS

## 2016-07-16 MED ORDER — PROPOFOL 10 MG/ML IV BOLUS
INTRAVENOUS | Status: DC | PRN
  Administered 2016-07-16: 130 mg via INTRAVENOUS
  Administered 2016-07-16: 40 mg via INTRAVENOUS
  Administered 2016-07-16: 30 mg via INTRAVENOUS

## 2016-07-16 MED ORDER — SCOPOLAMINE 1 MG/3DAYS TD PT72: 1.0000 | MEDICATED_PATCH | Freq: Once | TRANSDERMAL | Status: DC | PRN

## 2016-07-16 MED ORDER — CEFAZOLIN SODIUM-DEXTROSE 2-4 GM/100ML-% IV SOLN
2.0000 g | Freq: Once | INTRAVENOUS | Status: AC
  Administered 2016-07-16: 2 g via INTRAVENOUS

## 2016-07-16 MED ORDER — LIDOCAINE 2% (20 MG/ML) 5 ML SYRINGE
INTRAMUSCULAR | Status: AC
  Filled 2016-07-16: qty 5

## 2016-07-16 MED ORDER — LIDOCAINE 2% (20 MG/ML) 5 ML SYRINGE
INTRAMUSCULAR | Status: DC | PRN
  Administered 2016-07-16: 60 mg via INTRAVENOUS

## 2016-07-16 MED ORDER — HYDROMORPHONE HCL 1 MG/ML IJ SOLN: 0.2500 mg | INTRAMUSCULAR | Status: DC | PRN

## 2016-07-16 MED ORDER — SODIUM CHLORIDE 0.9 % IJ SOLN
INTRAMUSCULAR | Status: AC
  Filled 2016-07-16: qty 10

## 2016-07-16 MED ORDER — BUPIVACAINE-EPINEPHRINE 0.5% -1:200000 IJ SOLN
INTRAMUSCULAR | Status: DC | PRN
  Administered 2016-07-16: 10 mL

## 2016-07-16 MED ORDER — BUPIVACAINE-EPINEPHRINE (PF) 0.5% -1:200000 IJ SOLN
INTRAMUSCULAR | Status: DC | PRN
  Administered 2016-07-16: 30 mL

## 2016-07-16 MED ORDER — SODIUM CHLORIDE 0.9 % IJ SOLN
INTRAVENOUS | Status: DC | PRN
  Administered 2016-07-16: 5 mL

## 2016-07-16 MED ORDER — TECHNETIUM TC 99M SULFUR COLLOID FILTERED
1.0000 | Freq: Once | INTRAVENOUS | Status: AC | PRN
  Administered 2016-07-16: 1 via INTRADERMAL

## 2016-07-16 MED ORDER — GABAPENTIN 300 MG PO CAPS
300.0000 mg | ORAL_CAPSULE | ORAL | Status: AC
  Administered 2016-07-16: 300 mg via ORAL

## 2016-07-16 MED ORDER — PHENYLEPHRINE 40 MCG/ML (10ML) SYRINGE FOR IV PUSH (FOR BLOOD PRESSURE SUPPORT)
PREFILLED_SYRINGE | INTRAVENOUS | Status: AC
  Filled 2016-07-16: qty 10

## 2016-07-16 MED ORDER — DEXAMETHASONE SODIUM PHOSPHATE 10 MG/ML IJ SOLN
INTRAMUSCULAR | Status: AC
  Filled 2016-07-16: qty 1

## 2016-07-16 MED ORDER — CEFAZOLIN SODIUM 10 G IJ SOLR: 3.0000 g | INTRAMUSCULAR | Status: DC

## 2016-07-16 MED ORDER — CHLORHEXIDINE GLUCONATE CLOTH 2 % EX PADS: 6.0000 | MEDICATED_PAD | Freq: Once | CUTANEOUS | Status: DC

## 2016-07-16 MED ORDER — DEXAMETHASONE SODIUM PHOSPHATE 4 MG/ML IJ SOLN
INTRAMUSCULAR | Status: DC | PRN
  Administered 2016-07-16: 10 mg via INTRAVENOUS

## 2016-07-16 MED ORDER — IBUPROFEN 800 MG PO TABS: 800.0000 mg | ORAL_TABLET | Freq: Three times a day (TID) | ORAL | 0 refills | Status: DC | PRN

## 2016-07-16 MED ORDER — CELECOXIB 200 MG PO CAPS
ORAL_CAPSULE | ORAL | Status: AC
  Filled 2016-07-16: qty 2

## 2016-07-16 MED ORDER — ONDANSETRON HCL 4 MG/2ML IJ SOLN
INTRAMUSCULAR | Status: AC
  Filled 2016-07-16: qty 2

## 2016-07-16 SURGICAL SUPPLY — 41 items
APPLIER CLIP 9.375 MED OPEN (MISCELLANEOUS) ×3
BINDER BREAST LRG (GAUZE/BANDAGES/DRESSINGS) ×3 IMPLANT
BLADE SURG 15 STRL LF DISP TIS (BLADE) ×1 IMPLANT
BLADE SURG 15 STRL SS (BLADE) ×2
CANISTER SUCT 1200ML W/VALVE (MISCELLANEOUS) ×3 IMPLANT
CHLORAPREP W/TINT 26ML (MISCELLANEOUS) ×3 IMPLANT
CLIP APPLIE 9.375 MED OPEN (MISCELLANEOUS) ×1 IMPLANT
COVER BACK TABLE 60X90IN (DRAPES) ×3 IMPLANT
COVER MAYO STAND STRL (DRAPES) ×3 IMPLANT
COVER PROBE W GEL 5X96 (DRAPES) ×3 IMPLANT
DERMABOND ADVANCED (GAUZE/BANDAGES/DRESSINGS) ×2
DERMABOND ADVANCED .7 DNX12 (GAUZE/BANDAGES/DRESSINGS) ×1 IMPLANT
DEVICE DUBIN W/COMP PLATE 8390 (MISCELLANEOUS) ×3 IMPLANT
DRAPE LAPAROSCOPIC ABDOMINAL (DRAPES) ×3 IMPLANT
DRAPE UTILITY XL STRL (DRAPES) ×3 IMPLANT
ELECT COATED BLADE 2.86 ST (ELECTRODE) ×3 IMPLANT
ELECT REM PT RETURN 9FT ADLT (ELECTROSURGICAL) ×3
ELECTRODE REM PT RTRN 9FT ADLT (ELECTROSURGICAL) ×1 IMPLANT
GLOVE BIOGEL PI IND STRL 8 (GLOVE) ×1 IMPLANT
GLOVE BIOGEL PI INDICATOR 8 (GLOVE) ×2
GLOVE ECLIPSE 8.0 STRL XLNG CF (GLOVE) ×3 IMPLANT
GOWN STRL REUS W/ TWL LRG LVL3 (GOWN DISPOSABLE) ×2 IMPLANT
GOWN STRL REUS W/TWL LRG LVL3 (GOWN DISPOSABLE) ×4
HEMOSTAT SNOW SURGICEL 2X4 (HEMOSTASIS) ×3 IMPLANT
KIT MARKER MARGIN INK (KITS) ×3 IMPLANT
NDL SAFETY ECLIPSE 18X1.5 (NEEDLE) ×1 IMPLANT
NEEDLE HYPO 18GX1.5 SHARP (NEEDLE) ×2
NEEDLE HYPO 25X1 1.5 SAFETY (NEEDLE) ×6 IMPLANT
NS IRRIG 1000ML POUR BTL (IV SOLUTION) ×3 IMPLANT
PACK BASIN DAY SURGERY FS (CUSTOM PROCEDURE TRAY) ×3 IMPLANT
PENCIL BUTTON HOLSTER BLD 10FT (ELECTRODE) ×3 IMPLANT
SLEEVE SCD COMPRESS KNEE MED (MISCELLANEOUS) ×3 IMPLANT
SPONGE LAP 4X18 X RAY DECT (DISPOSABLE) ×3 IMPLANT
SUT MNCRL AB 4-0 PS2 18 (SUTURE) ×3 IMPLANT
SUT VICRYL 3-0 CR8 SH (SUTURE) ×3 IMPLANT
SYR CONTROL 10ML LL (SYRINGE) ×3 IMPLANT
TOWEL OR 17X24 6PK STRL BLUE (TOWEL DISPOSABLE) ×3 IMPLANT
TOWEL OR NON WOVEN STRL DISP B (DISPOSABLE) ×3 IMPLANT
TUBE CONNECTING 20'X1/4 (TUBING) ×1
TUBE CONNECTING 20X1/4 (TUBING) ×2 IMPLANT
YANKAUER SUCT BULB TIP NO VENT (SUCTIONS) ×3 IMPLANT

## 2016-07-16 NOTE — Interval H&P Note (Signed)
History and Physical Interval Note:  07/16/2016 9:24 AM  [place]  has presented today for surgery, with the diagnosis of LEFT BREAST CANCER  The various methods of treatment have been discussed with the patient and family. After consideration of risks, benefits and other options for treatment, the patient has consented to  Procedure(s): RADIOACTIVE SEED GUIDED LEFT PARTIAL MASTECTOMY WITH AXILLARY SENTINEL LYMPH NODE BIOPSY (Left) as a surgical intervention .  The patient's history has been reviewed, patient examined, no change in status, stable for surgery.  I have reviewed the patient's chart and labs.  Questions were answered to the patient's satisfaction.     Blaine Hari A.

## 2016-07-16 NOTE — Anesthesia Postprocedure Evaluation (Signed)
Anesthesia Post Note  Patient: Angelica West  Procedure(s) Performed: Procedure(s) (LRB): RADIOACTIVE SEED GUIDED LEFT PARTIAL MASTECTOMY WITH AXILLARY SENTINEL LYMPH NODE BIOPSY (Left)  Patient location during evaluation: PACU Anesthesia Type: General and Regional Level of consciousness: awake and alert Pain management: pain level controlled Vital Signs Assessment: post-procedure vital signs reviewed and stable Respiratory status: spontaneous breathing, nonlabored ventilation and respiratory function stable Cardiovascular status: blood pressure returned to baseline and stable Postop Assessment: no signs of nausea or vomiting Anesthetic complications: no       Last Vitals:  Vitals:   07/16/16 1200 07/16/16 1215  BP: (!) 144/89 (!) 142/85  Pulse: 60 (!) 59  Resp: 13 11  Temp:      Last Pain:  Vitals:   07/16/16 1215  TempSrc:   PainSc: 0-No pain                 Derek Huneycutt,W. EDMOND

## 2016-07-16 NOTE — Anesthesia Procedure Notes (Signed)
Procedure Name: Intubation Date/Time: 07/16/2016 10:10 AM Performed by: Lyndee Leo Pre-anesthesia Checklist: Patient identified, Emergency Drugs available, Suction available and Patient being monitored Patient Re-evaluated:Patient Re-evaluated prior to inductionOxygen Delivery Method: Circle system utilized Preoxygenation: Pre-oxygenation with 100% oxygen Intubation Type: IV induction Ventilation: Mask ventilation without difficulty Laryngoscope Size: Mac and 3 Grade View: Grade I Tube type: Oral Tube size: 7.0 mm Number of attempts: 1 Airway Equipment and Method: Stylet and Oral airway Placement Confirmation: ETT inserted through vocal cords under direct vision,  positive ETCO2 and breath sounds checked- equal and bilateral Secured at: 20 cm Tube secured with: Tape Dental Injury: Bloody posterior oropharynx  Difficulty Due To: Difficulty was unanticipated Comments: Attempts x 2 for LMA. Unable to seat. Dr [name] agrees to plan change to OET

## 2016-07-16 NOTE — Anesthesia Procedure Notes (Signed)
Anesthesia Regional Block: Pectoralis block   Pre-Anesthetic Checklist: ,, timeout performed, Correct Patient, Correct Site, Correct Laterality, Correct Procedure, Correct Position, site marked, Risks and benefits discussed, pre-op evaluation,  At surgeon's request and post-op pain management  Laterality: Left  Prep: Maximum Sterile Barrier Precautions used, chloraprep       Needles:  Injection technique: Single-shot  Needle Type: Echogenic Stimulator Needle     Needle Length: 9cm  Needle Gauge: 21     Additional Needles:   Procedures: ultrasound guided,,,,,,,,  Narrative:  Start time: 07/16/2016 9:10 AM End time: 07/16/2016 9:20 AM Injection made incrementally with aspirations every 5 mL. Anesthesiologist: Roderic Palau  Additional Notes: 2% Lidocaine skin wheel.

## 2016-07-16 NOTE — Anesthesia Preprocedure Evaluation (Addendum)
Anesthesia Evaluation  Patient identified by MRN, date of birth, ID band Patient awake    Reviewed: Allergy & Precautions, H&P , NPO status , Patient's Chart, lab work & pertinent test results  Airway Mallampati: II  TM Distance: >3 FB Neck ROM: Full    Dental no notable dental hx. (+) Teeth Intact, Dental Advisory Given   Pulmonary neg pulmonary ROS,    Pulmonary exam normal breath sounds clear to auscultation       Cardiovascular hypertension, Pt. on medications  Rhythm:Regular Rate:Normal     Neuro/Psych Anxiety negative neurological ROS  negative psych ROS   GI/Hepatic negative GI ROS, Neg liver ROS,   Endo/Other  negative endocrine ROS  Renal/GU negative Renal ROS  negative genitourinary   Musculoskeletal   Abdominal   Peds  Hematology negative hematology ROS (+)   Anesthesia Other Findings   Reproductive/Obstetrics negative OB ROS                            Anesthesia Physical Anesthesia Plan  ASA: II  Anesthesia Plan: General   Post-op Pain Management:  Regional for Post-op pain   Induction: Intravenous  Airway Management Planned: LMA  Additional Equipment:   Intra-op Plan:   Post-operative Plan: Extubation in OR  Informed Consent: I have reviewed the patients History and Physical, chart, labs and discussed the procedure including the risks, benefits and alternatives for the proposed anesthesia with the patient or authorized representative who has indicated his/her understanding and acceptance.   Dental advisory given  Plan Discussed with: CRNA  Anesthesia Plan Comments:         Anesthesia Quick Evaluation

## 2016-07-16 NOTE — Transfer of Care (Signed)
Immediate Anesthesia Transfer of Care Note  Patient: Angelica West  Procedure(s) Performed: Procedure(s): RADIOACTIVE SEED GUIDED LEFT PARTIAL MASTECTOMY WITH AXILLARY SENTINEL LYMPH NODE BIOPSY (Left)  Patient Location: PACU  Anesthesia Type:GA combined with regional for post-op pain  Level of Consciousness: sedated and responds to stimulation  Airway & Oxygen Therapy: Patient Spontanous Breathing and Patient connected to face mask oxygen  Post-op Assessment: Report given to RN and Post -op Vital signs reviewed and stable  Post vital signs: Reviewed and stable  Last Vitals:  Vitals:   07/16/16 0930 07/16/16 0934  BP: 117/67   Pulse: 73 78  Resp: 17 (!) 22  Temp:      Last Pain:  Vitals:   07/16/16 0856  TempSrc: Oral         Complications: No apparent anesthesia complications

## 2016-07-16 NOTE — Progress Notes (Signed)
Assisted Dr. Oren Bracket with left, ultrasound guided, pectoralis block. Side rails up, monitors on throughout procedure. See vital signs in flow sheet. Tolerated Procedure well.

## 2016-07-16 NOTE — H&P (View-Only) (Signed)
Angelica West 06/18/2016 7:52 AM Location: [facility] Patient #: [id number] DOB: 1951-01-27 Undefined / Language: Angelica West / Race: Refused to Report/Unreported Female  History of Present Illness Angelica West A. [name] MD; 06/18/2016 2:44 PM) Patient words: Pt sent at the reuest of Dr [name] for abnormal left breast mammogram. 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 Pt denies hx of breast mass, pain or nipple discharge. Pt has a family hostory of breast cancer. Denies any other complaints. She is sore a the biopsy site.               ADDENDUM REPORT: 06/10/2016 14:37 ADDENDUM: Patient presented 06/10/2016 for a stereotactic guided core biopsy of left breast calcifications. I met with the patient we discussed the procedure of stereotactic guided core biopsy. The patient was positioned for the biopsy and calcifications were identified on stereotactic images. Using sterile technique, 1 percent lidocaine was used for local anesthetic. A dermatotomy was made and the needle was placed in preparation for the biopsy. However, the patient had a vasovagal reaction and the needle was removed without tissue sampling. The patient had initial blood pressure of 80's/30's, with pulse in the lower 50s. Patient was supported with oxygen and Trendelenburg position until blood pressure returned to 109/65 and pulse of 64. She was observed for another 30-45 minutes was able to eat and drink without difficulty. Patient was discharged home. Procedure has been rescheduled for stereotactic biopsy on 06/11/2016. Electronically Signed By: [name] M.D. On: 06/10/2016 14:37 Addended by [name], MD on 06/10/2016 2:39 PM  Study Result CLINICAL DATA: Patient returns today to evaluate left breast calcifications identified on recent screening mammogram. EXAM: DIGITAL DIAGNOSTIC LEFT  MAMMOGRAM COMPARISON: Previous exam(s). ACR Breast Density Category c: The breast tissue is heterogeneously dense, which may obscure small masses. FINDINGS: Grouped punctate and amorphous calcifications are confirmed within the upper-outer quadrant of the left breast, at posterior depth, with a suspicious linear distribution spanning approximately 3.5 cm. IMPRESSION: Suspicious punctate and amorphous calcifications within the upper-outer quadrant of the left breast, at posterior depth, for which stereotactic biopsy is recommended. RECOMMENDATION: Stereotactic biopsy for the left breast calcifications. Stereotactic biopsy is scheduled for April 3rd at 11:30 a.m. I have discussed the findings and recommendations with the patient. Results were also provided in writing at the conclusion of the visit. If applicable, a reminder letter will be sent to the patient regarding the next appointment. BI-RADS CATEGORY 4: Suspicious. Electronically Signed: By: [name] M.D. On: 06/09/2016 16:46     ADDITIONAL INFORMATION: PROGNOSTIC INDICATORS Results: IMMUNOHISTOCHEMICAL AND MORPHOMETRIC ANALYSIS PERFORMED MANUALLY Estrogen Receptor: 85%, POSITIVE, STRONG STAINING INTENSITY Progesterone Receptor: 5%, POSITIVE, STRONG STAINING INTENSITY REFERENCE RANGE ESTROGEN RECEPTOR NEGATIVE 0% POSITIVE =>1% REFERENCE RANGE PROGESTERONE RECEPTOR NEGATIVE 0% POSITIVE =>1% All controls stained appropriately Enid Cutter MD Pathologist, Electronic Signature ( Signed 06/17/2016) FINAL DIAGNOSIS Diagnosis Breast, left, needle core biopsy, upper outer quadrant - DUCTAL CARCINOMA IN SITU WITH CALCIFICATIONS AND NECROSIS. Microscopic Comment The carcinoma appears intermediate grade. Prognostic markers will be ordered. Dr. Lyndon Code has reviewed the case. The case was called to [facility] on 06/12/2016. 1 of 2 FINAL for VIANKA, West ([id number]) Vicente Males MD Pathologist,  Electronic Signature (Case signed 06/12/2016) Specimen Gross and Clinical Information Specimen Comment In formalin 12:05 pm extracted < 5 min; calcifications left breast, UOQ Specimen(s) Obtained: Breast, left, needle core biopsy, upper outer quadrant Specimen  Clinical Information Suspect DCIS, possible Favoid changes or [place] Gross Received in formalin (TIF 12:05pm, CIT less than 5 minutes), labeled with the patient's.  The patient is a 66 year old female.   Past Surgical History Conni Slipper, RN; 06/18/2016 7:52 AM) Breast Biopsy Left.  Diagnostic Studies History Conni Slipper, RN; 06/18/2016 7:52 AM) Colonoscopy 1-5 years ago Mammogram within last year Pap Smear 1-5 years ago  Medication History Conni Slipper, RN; 06/18/2016 7:52 AM) Medications Reconciled  Social History Conni Slipper, RN; 06/18/2016 7:52 AM) Alcohol use Occasional alcohol use. Caffeine use Carbonated beverages, Coffee. Illicit drug use Remotely quit drug use. Tobacco use Never smoker.  Family History Conni Slipper, RN; 06/18/2016 7:52 AM) Cerebrovascular Accident Mother. Diabetes Mellitus Father. Heart Disease Father. Hypertension Brother, Father, Mother, Sister. Prostate Cancer Brother, Father.  Pregnancy / Birth History Conni Slipper, RN; 06/18/2016 7:52 AM) Age of menopause 51-55 Contraceptive History Oral contraceptives.  Other Problems Conni Slipper, RN; 06/18/2016 7:52 AM) Anxiety Disorder Arthritis High blood pressure Lump In Breast     Review of Systems Conni Slipper RN; 06/18/2016 7:52 AM) General Not Present- Appetite Loss, Chills, Fatigue, Fever, Night Sweats, Weight Gain and Weight Loss. Skin Not Present- Change in Wart/Mole, Dryness, Hives, Jaundice, New Lesions, Non-Healing Wounds, Rash and Ulcer. HEENT Present- Wears glasses/contact lenses. Not Present- Earache, Hearing Loss, Hoarseness, Nose Bleed, Oral Ulcers, Ringing in the Ears, Seasonal Allergies, Sinus Pain, Sore  Throat, Visual Disturbances and Yellow Eyes. Respiratory Present- Snoring. Not Present- Bloody sputum, Chronic Cough, Difficulty Breathing and Wheezing. Breast Present- Breast Mass. Not Present- Breast Pain, Nipple Discharge and Skin Changes. Cardiovascular Present- Leg Cramps. Not Present- Chest Pain, Difficulty Breathing Lying Down, Palpitations, Rapid Heart Rate, Shortness of Breath and Swelling of Extremities. Gastrointestinal Not Present- Abdominal Pain, Bloating, Bloody Stool, Change in Bowel Habits, Chronic diarrhea, Constipation, Difficulty Swallowing, Excessive gas, Gets full quickly at meals, Hemorrhoids, Indigestion, Nausea, Rectal Pain and Vomiting. Female Genitourinary Present- Frequency. Not Present- Nocturia, Painful Urination, Pelvic Pain and Urgency. Musculoskeletal Not Present- Back Pain, Joint Pain, Joint Stiffness, Muscle Pain, Muscle Weakness and Swelling of Extremities. Neurological Not Present- Decreased Memory, Fainting, Headaches, Numbness, Seizures, Tingling, Tremor, Trouble walking and Weakness. Psychiatric Present- Anxiety. Not Present- Bipolar, Change in Sleep Pattern, Depression, Fearful and Frequent crying. Endocrine Not Present- Cold Intolerance, Excessive Hunger, Hair Changes, Heat Intolerance, Hot flashes and New Diabetes. Hematology Present- Blood Thinners. Not Present- Easy Bruising, Excessive bleeding, Gland problems, HIV and Persistent Infections.   Physical Exam (Nailah Luepke A. Jamerion Cabello MD; 06/18/2016 2:44 PM)  General Mental Status-Alert. General Appearance-Consistent with stated age. Hydration-Well hydrated. Voice-Normal.  Head and Neck Head-normocephalic, atraumatic with no lesions or palpable masses. Trachea-midline. Thyroid Gland Characteristics - normal size and consistency.  Eye Eyeball - Bilateral-Extraocular movements intact. Sclera/Conjunctiva - Bilateral-No scleral icterus.  Chest and Lung Exam Chest and lung exam reveals  -quiet, even and easy respiratory effort with no use of accessory muscles and on auscultation, normal breath sounds, no adventitious sounds and normal vocal resonance. Inspection Chest Wall - Normal. Back - normal.  Breast Breast - Left-Symmetric, Non Tender, No Biopsy scars, no Dimpling, No Inflammation, No Lumpectomy scars, No Mastectomy scars, No Peau d' Orange. Breast - Right-Symmetric, Non Tender, No Biopsy scars, no Dimpling, No Inflammation, No Lumpectomy scars, No Mastectomy scars, No Peau d' Orange. Breast Lump-No Palpable Breast Mass. Note: bruising left axilla  Cardiovascular Cardiovascular examination reveals -normal heart sounds, regular rate and rhythm with no murmurs and normal pedal pulses bilaterally.  Abdomen Inspection  Inspection of the abdomen reveals - No Hernias. Skin - Scar - no surgical scars. Palpation/Percussion Palpation and Percussion of the abdomen reveal - Soft, Non Tender, No Rebound tenderness, No Rigidity (guarding) and No hepatosplenomegaly. Auscultation Auscultation of the abdomen reveals - Bowel sounds normal.  Neurologic Neurologic evaluation reveals -alert and oriented x 3 with no impairment of recent or remote memory. Mental Status-Normal.  Musculoskeletal Normal Exam - Left-Upper Extremity Strength Normal and Lower Extremity Strength Normal. Normal Exam - Right-Upper Extremity Strength Normal and Lower Extremity Strength Normal.  Lymphatic Head & Neck  General Head & Neck Lymphatics: Bilateral - Description - Normal. Axillary  General Axillary Region: Bilateral - Description - Normal. Tenderness - Non Tender. Femoral & Inguinal - Did not examine.    Assessment & Plan (Tsion Inghram A. Bartt Gonzaga MD; 06/18/2016 2:47 PM)  BREAST NEOPLASM, TIS (DCIS), LEFT (D05.12) Impression: discussed COMET trial and surgical options. Trial navigator will discuss COMET. If she is not a candidate, left breast partial mastectomy and SLN mapping  discussed. Wants to conserve her breast. Risk of lumpectomy include bleeding, infection, seroma, more surgery, use of seed/wire, wound care, cosmetic deformity and the need for other treatments, death , blood clots, death. Pt agrees to proceed. Risk of sentinel lymph node mapping include bleeding, infection, lymphedema, shoulder pain. stiffness, dye allergy. cosmetic deformity , blood clots, death, need for more surgery. Pt agres to proceed.  Current Plans You are being scheduled for surgery- Our schedulers will call you.  You should hear from our office's scheduling department within 5 working days about the location, date, and time of surgery. We try to make accommodations for patient's preferences in scheduling surgery, but sometimes the OR schedule or the surgeon's schedule prevents [place] from making those accommodations.  If you have not heard from our office [phone]) in 5 working days, call the office and ask for your surgeon's nurse.  If you have other questions about your diagnosis, plan, or surgery, call the office and ask for your surgeon's nurse.  Pt Education - [facility] Breast Cancer Information Given - [employer] "Breast Journey" Package We discussed the staging and pathophysiology of breast cancer. We discussed all of the different options for treatment for breast cancer including surgery, chemotherapy, radiation therapy, Herceptin, and antiestrogen therapy. We discussed a sentinel lymph node biopsy as she does not appear to having lymph node involvement right now. We discussed the performance of that with injection of radioactive tracer and blue dye. We discussed that she would have an incision underneath her axillary hairline. We discussed that there is a bout a 10-20% chance of having a positive node with a sentinel lymph node biopsy and we will await the permanent pathology to make any other first further decisions in terms of her treatment. One of these options might be to return to the  operating room to perform an axillary lymph node dissection. We discussed about a 1-2% risk lifetime of chronic shoulder pain as well as lymphedema associated with a sentinel lymph node biopsy. We discussed the options for treatment of the breast cancer which included lumpectomy versus a mastectomy. We discussed the performance of the lumpectomy with a wire placement. We discussed a 10-20% chance of a positive margin requiring reexcision in the operating room. We also discussed that she may need radiation therapy or antiestrogen therapy or both if she undergoes lumpectomy. We discussed the mastectomy and the postoperative care for that as well. We discussed that there is no difference in her survival whether  she undergoes lumpectomy with radiation therapy or antiestrogen therapy versus a mastectomy. There is a slight difference in the local recurrence rate being 3-5% with lumpectomy and about 1% with a mastectomy. We discussed the risks of operation including bleeding, infection, possible reoperation. She understands her further therapy will be based on what her stages at the time of her operation.  Pt Education - [facility] breast cancer surgery: discussed with patient and provided information. Pt Education - [facility] Breast Biopsy HCI: discussed with patient and provided information. Pt Education - ABC (After Breast Cancer) Class Info: discussed with patient and provided information.

## 2016-07-16 NOTE — Discharge Instructions (Signed)
[facility] °Office Phone Number [phone] ° °BREAST BIOPSY/ PARTIAL MASTECTOMY: POST OP INSTRUCTIONS ° °Always review your discharge instruction sheet given to you by the facility where your surgery was performed. ° °IF YOU HAVE DISABILITY OR FAMILY LEAVE FORMS, YOU MUST BRING THEM TO THE OFFICE FOR PROCESSING.  DO NOT GIVE THEM TO YOUR DOCTOR. ° °1. A prescription for pain medication may be given to you upon discharge.  Take your pain medication as prescribed, if needed.  If narcotic pain medicine is not needed, then you may take acetaminophen (Tylenol) or ibuprofen (Advil) as needed. °2. Take your usually prescribed medications unless otherwise directed °3. If you need a refill on your pain medication, please contact your pharmacy.  They will contact our office to request authorization.  Prescriptions will not be filled after 5pm or on week-ends. °4. You should eat very light the first 24 hours after surgery, such as soup, crackers, pudding, etc.  Resume your normal diet the day after surgery. °5. Most patients will experience some swelling and bruising in the breast.  Ice packs and a good support bra will help.  Swelling and bruising can take several days to resolve.  °6. It is common to experience some constipation if taking pain medication after surgery.  Increasing fluid intake and taking a stool softener will usually help or prevent this problem from occurring.  A mild laxative (Milk of Magnesia or Miralax) should be taken according to package directions if there are no bowel movements after 48 hours. °7. Unless discharge instructions indicate otherwise, you may remove your bandages 24-48 hours after surgery, and you may shower at that time.  You may have steri-strips (small skin tapes) in place directly over the incision.  These strips should be left on the skin for 7-10 days.  If your surgeon used skin glue on the incision, you may shower in 24 hours.  The glue will flake off over the  next 2-3 weeks.  Any sutures or staples will be removed at the office during your follow-up visit. °8. ACTIVITIES:  You may resume regular daily activities (gradually increasing) beginning the next day.  Wearing a good support bra or sports bra minimizes pain and swelling.  You may have sexual intercourse when it is comfortable. °a. You may drive when you no longer are taking prescription pain medication, you can comfortably wear a seatbelt, and you can safely maneuver your car and apply brakes. °b. RETURN TO WORK:  ______________________________________________________________________________________ °9. You should see your doctor in the office for a follow-up appointment approximately two weeks after your surgery.  Your doctor’s nurse will typically make your follow-up appointment when she calls you with your pathology report.  Expect your pathology report 2-3 business days after your surgery.  You may call to check if you do not hear from us after three days. °10. OTHER INSTRUCTIONS: _______________________________________________________________________________________________ _____________________________________________________________________________________________________________________________________ °_____________________________________________________________________________________________________________________________________ °_____________________________________________________________________________________________________________________________________ ° °WHEN TO CALL YOUR DOCTOR: °1. Fever over 101.0 °2. Nausea and/or vomiting. °3. Extreme swelling or bruising. °4. Continued bleeding from incision. °5. Increased pain, redness, or drainage from the incision. ° °The clinic staff is available to answer your questions during regular business hours.  Please don’t hesitate to call and ask to speak to one of the nurses for clinical concerns.  If you have a medical emergency, go to the nearest  emergency room or call 911.  A surgeon from [facility] is always on call at the hospital. ° °For further questions, please visit [email or web address]  ° ° °  Post Anesthesia Home Care Instructions ° °Activity: °Get plenty of rest for the remainder of the day. A responsible individual must stay with you for 24 hours following the procedure.  °For the next 24 hours, DO NOT: °-Drive a car °-Operate machinery °-Drink alcoholic beverages °-Take any medication unless instructed by your physician °-Make any legal decisions or sign important papers. ° °Meals: °Start with liquid foods such as gelatin or soup. Progress to regular foods as tolerated. Avoid greasy, spicy, heavy foods. If nausea and/or vomiting occur, drink only clear liquids until the nausea and/or vomiting subsides. Call your physician if vomiting continues. ° °Special Instructions/Symptoms: °Your throat may feel dry or sore from the anesthesia or the breathing tube placed in your throat during surgery. If this causes discomfort, gargle with warm salt water. The discomfort should disappear within 24 hours. ° °If you had a scopolamine patch placed behind your ear for the management of post- operative nausea and/or vomiting: ° °1. The medication in the patch is effective for 72 hours, after which it should be removed.  Wrap patch in a tissue and discard in the trash. Wash hands thoroughly with soap and water. °2. You may remove the patch earlier than 72 hours if you experience unpleasant side effects which may include dry mouth, dizziness or visual disturbances. °3. Avoid touching the patch. Wash your hands with soap and water after contact with the patch. °  °Call your surgeon if you experience:  ° °1.  Fever over 101.0. °2.  Inability to urinate. °3.  Nausea and/or vomiting. °4.  Extreme swelling or bruising at the surgical site. °5.  Continued bleeding from the incision. °6.  Increased pain, redness or drainage from the incision. °7.   Problems related to your pain medication. °8.  Any problems and/or concerns ° ° °

## 2016-07-16 NOTE — Op Note (Signed)
Preoperative diagnosis: Left breast DCIS upper outer quadrant  Postoperative diagnosis: Same  Procedure: Left breast seed localized partial mastectomy with left axillary  sentinel lymph node mapping using methylene blue dye  Surgeon: Thomas Cornett M.D.  Anesthesia: Gen. with pectoral block per anesthesia and 0.25% Sensorcaine local  Drains: None  EBL: Minimal  Specimen: Left breast tissue with 2 localizing seeds verified by radiology in the specimen and 3 left axillary sentinel nodes to pathology  Indications for procedure: The patient is a 66-year-old female who was found to have a mammographic abnormality on her screening mammogram. There is a density in the left breast upper-outer quadrant core biopsy proven to be DCIS with necrosis high-grade. Given its location she chose lumpectomy and I recommended sentinel lymph node mapping since this would be difficult to do if invasive disease is found within the specimen. Risks, benefits and alternative therapies were discussed with the patient as well as potential mastectomy is and alternative operation. She agreed with breast conservation.The procedure has been discussed with the patient. Alternatives to surgery have been discussed with the patient.  Risks of surgery include bleeding,  Infection,  Seroma formation, death,  and the need for further surgery.   The patient understands and wishes to proceed.Sentinel lymph node mapping and dissection has been discussed with the patient.  Risk of bleeding,  Infection,  Seroma formation,  Additional procedures,,  Shoulder weakness ,  Shoulder stiffness,  Nerve and blood vessel injury and reaction to the mapping dyes have been discussed.  Alternatives to surgery have been discussed with the patient.  The patient agrees to proceed. Of note the radiologist contacted me after seed placement and had to seeds in the breast since the first seed migrated from the initial biopsy spot in a second one was a line with the  marker.  Description of procedure: The patient was met in the holding area. Left breast was marked as correct side. She underwent block per anesthesia. She underwent injection by nuclear medicine in the left breast. All questions were answered. She's taken the operating room and placed upon the OR table. Her films were displayed in the operating room in both seeds could be visualized. After induction of general anesthesia, left breast was prepped and draped in a sterile fashion. Timeout was done. 4 mL of methylene blue dye admixed with saline were injected under the left nipple and massaged. Neoprobe was used and both cc were located in the left breast in the upper quadrant were marked with a pen on the scan. A left axillary incision was made. The first seed and clip were identified in the second seed was identified more medial and superior to this. This entire specimen was removed was 1 specimen. This was inked and radiograph revealed both seeds in the clip to be in the specimen was sent to pathology after being marked. The neoprobe was changed technetium settings from iodine. 3 hot level I level II sentinel nodes were identified with one being blue and hot nodes being hot and Surgicel being hot. These were removed. Hemostasis achieved. The cavity was clipped and Surgicel snow was placed into the axillary part of this. It was closed with 3-0 Vicryl and 4-0 Monocryl. Liquid adhesive applied. All final counts are found to be correct. The patient was awoke extubated taken to recovery in satisfactory condition.The [employer] narcotic database has been quieried and no conflicts identified.  

## 2016-07-17 ENCOUNTER — Encounter (HOSPITAL_BASED_OUTPATIENT_CLINIC_OR_DEPARTMENT_OTHER): Payer: Self-pay | Admitting: Surgery

## 2016-07-23 ENCOUNTER — Encounter: Payer: Self-pay | Admitting: Gynecology

## 2016-07-24 NOTE — Progress Notes (Signed)
Location of Breast Cancer: Left Breast  Histology per Pathology Report:  06/11/16 Diagnosis Breast, left, needle core biopsy, upper outer quadrant - DUCTAL CARCINOMA IN SITU WITH CALCIFICATIONS AND NECROSIS.  Receptor Status: ER(85%), PR (5%), Her2-neu (), Ki-()  07/16/16 Diagnosis 1. 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 - THE SURGICAL RESECTION MARGINS ARE NEGATIVE FOR CARCINOMA. - SEE ONCOLOGY TABLE BELOW. 2. Lymph node, sentinel, biopsy, Left - THERE IS NO EVIDENCE OF CARCINOMA IN 1 OF 1 LYMPH NODE (0/1). 3. Lymph node, sentinel, biopsy, Left - THERE IS NO EVIDENCE OF CARCINOMA IN 1 OF 1 LYMPH NODE (0/1). 4. Lymph node, sentinel, biopsy, Left - THERE IS NO EVIDENCE OF CARCINOMA IN 1 OF 1 LYMPH NODE (0/1). 5. Lymph node, sentinel, biopsy, Left - THERE IS NO EVIDENCE OF CARCINOMA IN 1 OF 1 LYMPH NODE (0/1).  Did patient present with symptoms or was this found on screening mammography?: It was found on a screening mammogram.   Past/Anticipated interventions by surgeon, if any: 07/17/15 Procedure: Left breast seed localized partial mastectomy with left axillary  sentinel lymph node mapping using methylene blue dye Surgeon: Erroll Luna M.D.  Past/Anticipated interventions by medical oncology, if any:  [name] 06/18/16  Genetics testing pending Breast conserving surgery planned (completed 07/16/16) Adjuvant radiation to follow Consider anti-estrogens at the completion of local treatment  Her next appointment with Dr. [name] is 09/22/16  Lymphedema issues, if any:  She denies, she has good arm mobility.    Pain issues, if any:  She denies  SAFETY ISSUES:  Prior radiation? No  Pacemaker/ICD? No  Possible current pregnancy? No  Is the patient on methotrexate? No  Current Complaints / other details:    BP (!) 106/59   Pulse 85   Temp 98.1 F (36.7 C)   Ht 5' 5.5" (1.664 m)   Wt 148 lb 9.6 oz  (67.4 kg)   LMP 03/10/1997   SpO2 99% Comment: room air  BMI 24.35 kg/m    Wt Readings from Last 3 Encounters:  07/29/16 148 lb 9.6 oz (67.4 kg)  07/16/16 147 lb 9.6 oz (67 kg)  06/18/16 148 lb 11.2 oz (67.4 kg)      Annie Roseboom, Stephani Police, RN 07/24/2016,12:12 PM

## 2016-07-28 ENCOUNTER — Telehealth: Payer: Self-pay

## 2016-07-28 ENCOUNTER — Other Ambulatory Visit (HOSPITAL_COMMUNITY): Payer: Self-pay | Admitting: Surgery

## 2016-07-28 NOTE — Telephone Encounter (Signed)
Pt called asking if she can have a dental procedure, possibly teeth pulled. She has had breast surgery May 9th, she has not started XRT nor chemo. She has initial  appt with Dr Isidore Moos on 5/22. Advised her that is it OK to have dental procedure at this time.

## 2016-07-29 ENCOUNTER — Encounter: Payer: Self-pay | Admitting: Radiation Oncology

## 2016-07-29 ENCOUNTER — Ambulatory Visit
Admission: RE | Admit: 2016-07-29 | Discharge: 2016-07-29 | Disposition: A | Discharge status: Home / Self Care | Payer: 59 | Source: Ambulatory Visit | Attending: Radiation Oncology | Admitting: Radiation Oncology

## 2016-07-29 DIAGNOSIS — F419 Anxiety disorder, unspecified: Secondary | ICD-10-CM | POA: Diagnosis not present

## 2016-07-29 DIAGNOSIS — Z170 Estrogen receptor positive status [ER+]: Principal | ICD-10-CM

## 2016-07-29 DIAGNOSIS — Z809 Family history of malignant neoplasm, unspecified: Secondary | ICD-10-CM | POA: Diagnosis not present

## 2016-07-29 DIAGNOSIS — I10 Essential (primary) hypertension: Secondary | ICD-10-CM | POA: Diagnosis not present

## 2016-07-29 DIAGNOSIS — Z8249 Family history of ischemic heart disease and other diseases of the circulatory system: Secondary | ICD-10-CM | POA: Diagnosis not present

## 2016-07-29 DIAGNOSIS — C50412 Malignant neoplasm of upper-outer quadrant of left female breast: Secondary | ICD-10-CM

## 2016-07-29 DIAGNOSIS — Z823 Family history of stroke: Secondary | ICD-10-CM | POA: Diagnosis not present

## 2016-07-29 DIAGNOSIS — Z79899 Other long term (current) drug therapy: Secondary | ICD-10-CM | POA: Diagnosis not present

## 2016-07-29 DIAGNOSIS — Z510 Encounter for antineoplastic radiation therapy: Secondary | ICD-10-CM | POA: Diagnosis not present

## 2016-07-29 DIAGNOSIS — Z833 Family history of diabetes mellitus: Secondary | ICD-10-CM | POA: Diagnosis not present

## 2016-07-29 NOTE — Progress Notes (Signed)
Radiation Oncology         [phone] ________________________________  Name: Angelica West MRN: [id number]  Date: 07/29/2016  DOB: 1950-05-29  Follow-Up Visit Note  Outpatient  CC: Leighton Ruff, MD  Magrinat, Virgie Dad, MD  Diagnosis:      ICD-9-CM ICD-10-CM   1. 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 She was initially seen in consultation during the multidisciplinary Breast Clinic on 06/18/16.   Since consultation, she underwent lumpectomy and sentinel lymph node biopsy with Dr. Brantley Stage on 07/16/16. This revealed 1.1 cm span of DCIS with characteristics as above in the diagnosis. The four sentinel lymph nodes biopsied were negative. Focal necrosis present in DCIS. Margins were negative by at least 2 mm.  On review of systems, the patient denies lymphedema issues. She reports good range of motion and mobility in left arm. She denies pain. She reports she is healing well from surgery.  The patient reports she is "afraid of radiation therapy." She reports a similar reaction to the term "radiation" as she has to the term "chemotherapy," feeling that it is a "devastating" treatment. She cites the experience of a close friend who had chemotherapy and experienced significant side effects.  The patient will follow up with Dr. Brantley Stage on Friday 5/25.           ALLERGIES:  has No Known Allergies.  Meds: Current Outpatient Prescriptions  Medication Sig Dispense Refill  . Ascorbic Acid (VITAMIN C) 100 MG tablet Take 100 mg by mouth daily.    Marland Kitchen aspirin 81 MG tablet Take 81 mg by mouth daily.      . Calcium Carbonate-Vitamin D (CALCIUM + D PO) Take by mouth.      .  Cholecalciferol (VITAMIN D PO) Take by mouth.      . co-enzyme Q-10 30 MG capsule Take 30 mg by mouth 3 (three) times daily.    . fish oil-omega-3 fatty acids 1000 MG capsule Take 2 g by mouth daily.    Marland Kitchen GARLIC PO Take by mouth.      Marland Kitchen GLUCOSAMINE PO Take by mouth.      . hydrochlorothiazide (HYDRODIURIL) 25 MG tablet Take 25 mg by mouth daily.    Marland Kitchen ibuprofen (ADVIL,MOTRIN) 800 MG tablet Take 1 tablet (800 mg total) by mouth every 8 (eight) hours as needed. 30 tablet 0  . lisinopril (PRINIVIL,ZESTRIL) 10 MG tablet Take 10 mg by mouth daily.    . Multiple Vitamin (MULTIVITAMIN) capsule Take 1 capsule by mouth daily.      Marland Kitchen UNABLE TO FIND Med Name: she is taking 1/4 teaspoon daily    . zinc gluconate 50 MG tablet Take 50 mg by mouth daily.    Marland Kitchen HYDROcodone-acetaminophen (NORCO/VICODIN) 5-325 MG tablet Take 1-2 tablets by mouth every 6 (six) hours as needed for moderate pain. (Patient not taking: Reported on 07/29/2016) 15 tablet 0   No current facility-administered medications for this encounter.    Physical Findings:  height is 5' 5.5" (1.664 m) and weight is 148 lb 9.6 oz (67.4 kg). Her temperature is 98.1 F (36.7 C). Her blood pressure is 106/59 (abnormal) and her pulse is 85. Her oxygen  saturation is 99%.  General: Alert and oriented, in no acute distress. Musculoskeletal: Good ROM in left arm. Neurologic: No obvious focalities. Speech is fluent.  Psychiatric: Judgment and insight are intact. Affect is appropriate. Breast exam reveals left upper outer quadrant scar with residual glue, which is healing well. There is some post operative swelling in the area, within normal limits. Faint erythema centrally in the left breast, which is not a clinical concern.  Lab Findings: Lab Results  Component Value Date   WBC 5.1 06/18/2016   HGB 12.5 06/18/2016   HCT 37.4 06/18/2016   MCV 88.4 06/18/2016   PLT 218 06/18/2016   Radiographic Findings: Mm Breast Surgical Specimen  Result Date:  07/16/2016 CLINICAL DATA:  Biopsy proven ductal carcinoma in-situ in the left breast. EXAM: SPECIMEN RADIOGRAPH OF THE LEFT BREAST COMPARISON:  Previous exam(s). FINDINGS: Status post excision of the left breast. The radioactive seeds and biopsy marker clip are present, completely intact, and were marked for pathology. IMPRESSION: Specimen radiograph of the left breast. Electronically Signed   By: Lillia Mountain M.D.   On: 07/16/2016 10:47   Mm Lt Radioactive Seed Loc Mammo Guide  Addendum Date: 07/14/2016   ADDENDUM REPORT: 07/14/2016 16:40 ADDENDUM: These results were called by telephone at the time of interpretation on 07/14/2016 at 4:16 pm to Dr. Erroll Luna , who verbally acknowledged these results. Electronically Signed   By: Fidela Salisbury M.D.   On: 07/14/2016 16:40   Result Date: 07/14/2016 CLINICAL DATA:  Preoperative radioactive seed localization of left breast upper outer quadrant DCIS. EXAM: MAMMOGRAPHIC GUIDED RADIOACTIVE SEED LOCALIZATION OF THE LEFT BREAST COMPARISON:  Previous exam(s). FINDINGS: Patient presents for radioactive seed localization prior to left breast lumpectomy. I met with the patient and we discussed the procedure of seed localization including benefits and alternatives. We discussed the high likelihood of a successful procedure. We discussed the risks of the procedure including infection, bleeding, seed displacement, tissue injury and further surgery. We discussed the low dose of radioactivity involved in the procedure. Informed, written consent was given. The usual time-out protocol was performed immediately prior to the procedure. Using mammographic guidance, sterile technique, 1% lidocaine and an I-125 radioactive seed, the left breast upper outer quadrant, far posterior depth, coil shaped post biopsy marker was localized using a lateral approach. Initially, a 3D guided seed placement was attempted, due to the far posterior location of the lesion, and difficulty in  visualizing it in the craniocaudal view. However, the seed placed under 3D guidance ended up being located 4 cm medially and 2.1 cm inferiorly from the post biopsy marker. Therefore, a second radioactive seed was placed using 2D guidance and utilizing exaggerated craniocaudal view and ML view during the procedure. The second seed is located immediately adjacent to the post biopsy tissue marker, within the area of DCIS. The follow-up mammogram images confirm the seed in the expected location and were marked for Dr. Brantley Stage. Follow-up survey of the patient confirms presence of the radioactive seed. First seed: Order number of I-125 seed:  [id number]. Total activity:  2.035 millicurie  Reference Date: June 10, 2016 Second seed: Order number of I-125 seed:  [id number]. Total activity:  5.364 millicurie  Reference Date: July 03, 2016 The patient tolerated the procedure well and was released from the [facility]. She was given instructions regarding seed removal. IMPRESSION: Radioactive seed localization of the left breast breast. Two radioactive seeds have been placed within the left breast. The radioactive seed within the far upper  outer quadrant of the left breast, posterior depth, is associated with the area of DCIS and the coil shaped post biopsy marker. Electronically Signed: By: Fidela Salisbury M.D. On: 07/14/2016 16:14   Mm Lt Rad Seed Ea Add Lesion Loc Mammo  Result Date: 07/14/2016 CLINICAL DATA:  Preoperative radioactive seed localization of left breast upper outer quadrant DCIS. EXAM: MAMMOGRAPHIC GUIDED RADIOACTIVE SEED LOCALIZATION OF THE LEFT BREAST COMPARISON:  Previous exam(s). FINDINGS: Patient presents for radioactive seed localization prior to left breast lumpectomy. I met with the patient and we discussed the procedure of seed localization including benefits and alternatives. We discussed the high likelihood of a successful procedure. We discussed the risks of the procedure including  infection, bleeding, tissue injury and further surgery. We discussed the low dose of radioactivity involved in the procedure. Informed, written consent was given. The usual time-out protocol was performed immediately prior to the procedure. Using mammographic guidance, sterile technique, 1% lidocaine and an I-125 radioactive seed, left breast upper outer quadrant, far posterior depth, coil shaped post biopsy marker was localized using a lateral approach. Initially, a 3D guided seed placement was attempted, due to the far posterior location of the lesion, and difficulty in visualizing it in the craniocaudal view. However, the seed placed under the 3D guidance ended up being located 4 cm medially and 2.1 cm inferiorly from the post biopsy marker. Therefore, a second radioactive seed was placed using 2D guidance and utilizing exaggerated craniocaudal view and ML view during the procedure. The second seed is located immediately adjacent to the post biopsy tissue marker, within the area of DCIS. The follow-up mammogram images confirm the seed in the expected location and were marked for Dr. Brantley Stage. Follow-up survey of the patient confirms presence of the radioactive seeds. For seed: Order number of I-125 seed:  [id number]. Total activity:  8.299 millicurie  Reference Date: June 10, 2016 Second seat: Order number of I-125 seed:  [id number]. Total activity:  3.810 millicurie  Reference Date: July 03, 2016 The patient tolerated the procedure well and was released from the [place]. She was given instructions regarding seed removal. IMPRESSION: Radioactive seed localization left breast. The radioactive seed within the far upper outer quadrant of the left breast, posterior depth, is associated with the area of DCIS and the coil shaped post biopsy marker. These results were called by telephone at the time of interpretation on 07/14/2016 at 4:16 pm to Dr. Erroll Luna , who verbally acknowledged these results.  Electronically Signed   By: Fidela Salisbury M.D.   On: 07/14/2016 16:46   Impression/Plan: We discussed adjuvant radiotherapy today.  I recommend 4 weeks of radiotherapy in order to reduce the risk of locoregional recurrence from 13-->5% over 10 years per the [facility] nomogram results that we reviewd and I printed today.  The risks, benefits and side effects of this treatment were discussed in detail.  She understands that radiotherapy is associated with skin irritation and fatigue in the acute setting. Late effects can include cosmetic changes and rare injury to internal organs.    We discussed at length the nature of therapeutic radiation versus radiation exposure in an uncontrolled setting. I assured the patient that if the risks outweighed the benefits, I would not offer radiotherapy. We specifically focused on the targeted nature of radiotherapy, which would not cause side effects in other parts of the body not being treated.   Additionally, we completed a Breast Cancer Nomogram today, and discussed the risks of recurrence with various  treatment options. I printed this information today and provided a copy for the patient to review.  She has my business card. The patient would like to consider her options before deciding on further course of treatment. She states will contact the clinic within the next few weeks when she decides how she would like to proceed.   I spent 40 minutes minutes face to face with the patient and more than 50% of that time was spent in counseling and/or coordination of care. _____________________________________   Eppie Gibson, MD  This document serves as a record of services personally performed by Eppie Gibson, MD. It was created on her behalf by Maryla Morrow, a trained medical scribe. The creation of this record is based on the scribe's personal observations and the provider's statements to them. This document has been checked and approved by the attending  provider.

## 2016-08-05 ENCOUNTER — Telehealth: Payer: Self-pay | Admitting: Oncology

## 2016-08-05 NOTE — Telephone Encounter (Signed)
Confirmed 5/31 appt at 330 per sch msg

## 2016-08-07 ENCOUNTER — Ambulatory Visit (HOSPITAL_BASED_OUTPATIENT_CLINIC_OR_DEPARTMENT_OTHER): Payer: 59 | Admitting: Oncology

## 2016-08-07 VITALS — BP 127/66 | HR 71 | Temp 98.1°F | Resp 18 | Ht 65.5 in | Wt 151.9 lb

## 2016-08-07 DIAGNOSIS — D0512 Intraductal carcinoma in situ of left breast: Secondary | ICD-10-CM

## 2016-08-07 DIAGNOSIS — Z170 Estrogen receptor positive status [ER+]: Secondary | ICD-10-CM | POA: Diagnosis not present

## 2016-08-07 DIAGNOSIS — C50412 Malignant neoplasm of upper-outer quadrant of left female breast: Secondary | ICD-10-CM

## 2016-08-07 NOTE — Progress Notes (Signed)
[facility]  [phone] [phone]     ID: Angelica West DOB: 04-12-1950  MR#: [id number]  PQD#:[id number]  Patient Care Team: Leighton Ruff, MD as PCP - General (Family Medicine) Lin Landsman, MD (Family Medicine) Erroll Luna, MD as Consulting Physician (General Surgery) Allena Pietila, Virgie Dad, MD as Consulting Physician (Oncology) Eppie Gibson, MD as Attending Physician (Radiation Oncology) Chauncey Cruel, MD OTHER MD:  CHIEF COMPLAINT: Ductal carcinoma in situ  CURRENT TREATMENT: Awaiting    BREAST CANCER HISTORY: The patient had screening bilateral mammography showing an area of pleomorphic calcifications in the left breast upper outer quadrant. Diagnostic mammography of the left breast on 06/10/2016 at the [facility] showed the breast density to be category C. There was an area of grouped punctate and amorphus calcifications in the upper outer quadrant measuring up to 3.5 cm. 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  Her subsequent history is as detailed below  INTERVAL HISTORY: Angelica West returns today for follow-up of her noninvasive breast cancer accompanied by her sister and husband. Since her last visit here she underwent left lumpectomy, on 07/16/2016, showing ductal carcinoma in situ, grade 2, measuring 1.1 cm. Margins were clear and all 4 sentinel lymph nodes were clear as well.  The patient has met with Dr. Isidore Moos and is considering adjuvant radiation. She may today's appointment specifically to discuss whether she could have anti-estrogens instead   REVIEW OF SYSTEMS: She did remarkably well with her surgery, with no significant pain, and already has gone back to work. It was no fever and no bleeding. A detailed review of systems today was benign   PAST MEDICAL HISTORY: Past  Medical History:  Diagnosis Date  . Anxiety   . Hypertension     PAST SURGICAL HISTORY: Past Surgical History:  Procedure Laterality Date  . BILATERAL SALPINGONEOSTOMY, LYSIS OF ADHESIONS, R OV CYSTECTOMY    . RADIOACTIVE SEED GUIDED MASTECTOMY WITH AXILLARY SENTINEL LYMPH NODE BIOPSY Left 07/16/2016   Procedure: RADIOACTIVE SEED GUIDED LEFT PARTIAL MASTECTOMY WITH AXILLARY SENTINEL LYMPH NODE BIOPSY;  Surgeon: Erroll Luna, MD;  Location: [facility];  Service: General;  Laterality: Left;    FAMILY HISTORY Family History  Problem Relation Age of Onset  . Hypertension Mother   . Osteoporosis Mother   . Stroke Mother        RECENTLY DIED  . Hypertension Father   . Diabetes Father   . Heart disease Father   . Cancer Father        PROSTATE  . Hypertension Sister   . Hypertension Brother   . Cancer Brother        PROSTATE  The patient's father was diagnosed with prostate cancer age 67. He died at age 67. The patient's mother died from not cancer related causes at age 67. The patient has a brother who was diagnosed with prostate cancer at age 67. She also has a sister. There is no history of ovarian cancer in the family.  GYNECOLOGIC HISTORY:  Patient's last menstrual period was 03/10/1997. Menarche age 22. The patient is GX P0. She does not recall exactly when she went through menopause. She did not take hormone replacement. She did use oral contraceptives approximately 5 years remotely with no side effects that she is aware of  SOCIAL HISTORY:  The patient is a Clinical cytogeneticist. At home it's just she  and her husband Angelica West    ADVANCED DIRECTIVES: Not in place   HEALTH MAINTENANCE: Social History  Substance Use Topics  . Smoking status: Never Smoker  . Smokeless tobacco: Never Used  . Alcohol use No     Colonoscopy: 2017  PAP:  Bone density: Never   No Known Allergies  Current Outpatient Prescriptions  Medication Sig Dispense Refill  .  Ascorbic Acid (VITAMIN C) 100 MG tablet Take 100 mg by mouth daily.    Marland Kitchen aspirin 81 MG tablet Take 81 mg by mouth daily.      . Calcium Carbonate-Vitamin D (CALCIUM + D PO) Take by mouth.      . Cholecalciferol (VITAMIN D PO) Take by mouth.      . co-enzyme Q-10 30 MG capsule Take 30 mg by mouth 3 (three) times daily.    . fish oil-omega-3 fatty acids 1000 MG capsule Take 2 g by mouth daily.    Marland Kitchen GARLIC PO Take by mouth.      Marland Kitchen GLUCOSAMINE PO Take by mouth.      . hydrochlorothiazide (HYDRODIURIL) 25 MG tablet Take 25 mg by mouth daily.    Marland Kitchen HYDROcodone-acetaminophen (NORCO/VICODIN) 5-325 MG tablet Take 1-2 tablets by mouth every 6 (six) hours as needed for moderate pain. (Patient not taking: Reported on 07/29/2016) 15 tablet 0  . ibuprofen (ADVIL,MOTRIN) 800 MG tablet Take 1 tablet (800 mg total) by mouth every 8 (eight) hours as needed. 30 tablet 0  . lisinopril (PRINIVIL,ZESTRIL) 10 MG tablet Take 10 mg by mouth daily.    . Multiple Vitamin (MULTIVITAMIN) capsule Take 1 capsule by mouth daily.      Marland Kitchen UNABLE TO FIND Med Name: she is taking 1/4 teaspoon daily    . zinc gluconate 50 MG tablet Take 50 mg by mouth daily.     No current facility-administered medications for this visit.     OBJECTIVE: Middle-aged African-American woman Who appears well  Vitals:   08/07/16 1539  BP: 127/66  Pulse: 71  Resp: 18  Temp: 98.1 F (36.7 C)     Body mass index is 24.89 kg/m.    ECOG FS:0 - Asymptomatic  Sclerae unicteric, pupils round and equal Oropharynx clear and moist No cervical or supraclavicular adenopathy Lungs no rales or rhonchi Heart regular rate and rhythm Abd soft, nontender, positive bowel sounds MSK no focal spinal tenderness, no upper extremity lymphedema Neuro: nonfocal, well oriented, appropriate affect Breasts: The right breast is unremarkable. The left breast is status post recent lumpectomy. The cosmetic result is good. There is mild edema. There is no erythema. There is  no dehiscence. Both axillae are benign.  LAB RESULTS:  CMP     Component Value Date/Time   NA 141 06/18/2016 1234   K 3.9 06/18/2016 1234   CL 100 02/25/2013 0846   CO2 28 06/18/2016 1234   GLUCOSE 89 06/18/2016 1234   BUN 16.6 06/18/2016 1234   CREATININE 1.0 06/18/2016 1234   CALCIUM 9.8 06/18/2016 1234   PROT 7.4 06/18/2016 1234   ALBUMIN 3.9 06/18/2016 1234   AST 22 06/18/2016 1234   ALT 22 06/18/2016 1234   ALKPHOS 80 06/18/2016 1234   BILITOT 0.43 06/18/2016 1234    No results found for: TOTALPROTELP, ALBUMINELP, A1GS, A2GS, BETS, BETA2SER, GAMS, MSPIKE, SPEI  No results found for: KPAFRELGTCHN, LAMBDASER, KAPLAMBRATIO  Lab Results  Component Value Date   WBC 5.1 06/18/2016   NEUTROABS 2.7 06/18/2016   HGB 12.5 06/18/2016  HCT 37.4 06/18/2016   MCV 88.4 06/18/2016   PLT 218 06/18/2016      Chemistry      Component Value Date/Time   NA 141 06/18/2016 1234   K 3.9 06/18/2016 1234   CL 100 02/25/2013 0846   CO2 28 06/18/2016 1234   BUN 16.6 06/18/2016 1234   CREATININE 1.0 06/18/2016 1234      Component Value Date/Time   CALCIUM 9.8 06/18/2016 1234   ALKPHOS 80 06/18/2016 1234   AST 22 06/18/2016 1234   ALT 22 06/18/2016 1234   BILITOT 0.43 06/18/2016 1234       No results found for: LABCA2  No components found for: [id number]  No results for input(s): INR in the last 168 hours.  Urinalysis    Component Value Date/Time   COLORURINE YELLOW 02/25/2013 0846   APPEARANCEUR CLEAR 02/25/2013 0846   LABSPEC 1.006 02/25/2013 0846   PHURINE 6.5 02/25/2013 0846   GLUCOSEU NEG 02/25/2013 0846   HGBUR NEG 02/25/2013 0846   BILIRUBINUR NEG 02/25/2013 0846   KETONESUR NEG 02/25/2013 0846   PROTEINUR NEG 02/25/2013 0846   UROBILINOGEN 0.2 02/25/2013 0846   NITRITE NEG 02/25/2013 0846   LEUKOCYTESUR NEG 02/25/2013 0846     STUDIES: Mm Breast Surgical Specimen  Result Date: 07/16/2016 CLINICAL DATA:  Biopsy proven ductal carcinoma in-situ in the  left breast. EXAM: SPECIMEN RADIOGRAPH OF THE LEFT BREAST COMPARISON:  Previous exam(s). FINDINGS: Status post excision of the left breast. The radioactive seeds and biopsy marker clip are present, completely intact, and were marked for pathology. IMPRESSION: Specimen radiograph of the left breast. Electronically Signed   By: Lillia Mountain M.D.   On: 07/16/2016 10:47   Mm Lt Radioactive Seed Loc Mammo Guide  Addendum Date: 07/14/2016   ADDENDUM REPORT: 07/14/2016 16:40 ADDENDUM: These results were called by telephone at the time of interpretation on 07/14/2016 at 4:16 pm to Dr. Erroll Luna , who verbally acknowledged these results. Electronically Signed   By: Fidela Salisbury M.D.   On: 07/14/2016 16:40   Result Date: 07/14/2016 CLINICAL DATA:  Preoperative radioactive seed localization of left breast upper outer quadrant DCIS. EXAM: MAMMOGRAPHIC GUIDED RADIOACTIVE SEED LOCALIZATION OF THE LEFT BREAST COMPARISON:  Previous exam(s). FINDINGS: Patient presents for radioactive seed localization prior to left breast lumpectomy. I met with the patient and we discussed the procedure of seed localization including benefits and alternatives. We discussed the high likelihood of a successful procedure. We discussed the risks of the procedure including infection, bleeding, seed displacement, tissue injury and further surgery. We discussed the low dose of radioactivity involved in the procedure. Informed, written consent was given. The usual time-out protocol was performed immediately prior to the procedure. Using mammographic guidance, sterile technique, 1% lidocaine and an I-125 radioactive seed, the left breast upper outer quadrant, far posterior depth, coil shaped post biopsy marker was localized using a lateral approach. Initially, a 3D guided seed placement was attempted, due to the far posterior location of the lesion, and difficulty in visualizing it in the craniocaudal view. However, the seed placed under 3D  guidance ended up being located 4 cm medially and 2.1 cm inferiorly from the post biopsy marker. Therefore, a second radioactive seed was placed using 2D guidance and utilizing exaggerated craniocaudal view and ML view during the procedure. The second seed is located immediately adjacent to the post biopsy tissue marker, within the area of DCIS. The follow-up mammogram images confirm the seed in the expected location and were  marked for Dr. Brantley Stage. Follow-up survey of the patient confirms presence of the radioactive seed. First seed: Order number of I-125 seed:  [id number]. Total activity:  5.681 millicurie  Reference Date: June 10, 2016 Second seed: Order number of I-125 seed:  [id number]. Total activity:  4.944 millicurie  Reference Date: July 03, 2016 The patient tolerated the procedure well and was released from the [facility]. She was given instructions regarding seed removal. IMPRESSION: Radioactive seed localization of the left breast breast. Two radioactive seeds have been placed within the left breast. The radioactive seed within the far upper outer quadrant of the left breast, posterior depth, is associated with the area of DCIS and the coil shaped post biopsy marker. Electronically Signed: By: Fidela Salisbury M.D. On: 07/14/2016 16:14   Mm Lt Rad Seed Ea Add Lesion Loc Mammo  Result Date: 07/14/2016 CLINICAL DATA:  Preoperative radioactive seed localization of left breast upper outer quadrant DCIS. EXAM: MAMMOGRAPHIC GUIDED RADIOACTIVE SEED LOCALIZATION OF THE LEFT BREAST COMPARISON:  Previous exam(s). FINDINGS: Patient presents for radioactive seed localization prior to left breast lumpectomy. I met with the patient and we discussed the procedure of seed localization including benefits and alternatives. We discussed the high likelihood of a successful procedure. We discussed the risks of the procedure including infection, bleeding, tissue injury and further surgery. We discussed the low dose of  radioactivity involved in the procedure. Informed, written consent was given. The usual time-out protocol was performed immediately prior to the procedure. Using mammographic guidance, sterile technique, 1% lidocaine and an I-125 radioactive seed, left breast upper outer quadrant, far posterior depth, coil shaped post biopsy marker was localized using a lateral approach. Initially, a 3D guided seed placement was attempted, due to the far posterior location of the lesion, and difficulty in visualizing it in the craniocaudal view. However, the seed placed under the 3D guidance ended up being located 4 cm medially and 2.1 cm inferiorly from the post biopsy marker. Therefore, a second radioactive seed was placed using 2D guidance and utilizing exaggerated craniocaudal view and ML view during the procedure. The second seed is located immediately adjacent to the post biopsy tissue marker, within the area of DCIS. The follow-up mammogram images confirm the seed in the expected location and were marked for Dr. Brantley Stage. Follow-up survey of the patient confirms presence of the radioactive seeds. For seed: Order number of I-125 seed:  [id number]. Total activity:  4.665 millicurie  Reference Date: June 10, 2016 Second seat: Order number of I-125 seed:  [id number]. Total activity:  9.390 millicurie  Reference Date: July 03, 2016 The patient tolerated the procedure well and was released from the [facility]. She was given instructions regarding seed removal. IMPRESSION: Radioactive seed localization left breast. The radioactive seed within the far upper outer quadrant of the left breast, posterior depth, is associated with the area of DCIS and the coil shaped post biopsy marker. These results were called by telephone at the time of interpretation on 07/14/2016 at 4:16 pm to Dr. Erroll Luna , who verbally acknowledged these results. Electronically Signed   By: Fidela Salisbury M.D.   On: 07/14/2016 16:46    ELIGIBLE FOR  AVAILABLE RESEARCH PROTOCOL: Not in candidate for the COMET trial because of necrosis   ASSESSMENT: 66 y.o. [place] woman status post left breast upper outer quadrant biopsy 06/11/2016 showing ductal carcinoma in situ, grade 2, estrogen and progesterone receptor positive  (1) genetics testing pending  (2) status post left lumpectomy and sentinel  lymph node sampling 07/16/2016 for ductal carcinoma in situ, grade 2, with negative margins.  (3) adjuvant radiation to follow  (4) consider anti-estrogens at the completion of local treatment  PLAN We spent approximately 35 minutes going over her again the meaning of ductal carcinoma in situ equals noninvasive breast cancer, and emphasizing the fact that these cancers cannot travel outside the breast and therefore are not life-threatening.  By keeping the breast she is accepting some risk of recurrence. Adjuvant radiation is a very effective way of reducing that risk, we by more than half.  Adjuvant anti-estrogens also would cut the risk in half. They in addition have a systemic effect in that they reduce the risk of a new breast cancer developing as well as the old breast cancer recurring.  We then reviewed the possible toxicities side effects and complications of anastrozole and tamoxifen. The patient was somewhat taken aback by the possible side effects and the fact that she would have to take these medications for 5 years get the full effect.  By comparison radiation would be only 4 weeks. With the current procedures damage to the heart is rare or nonexistent and after all we are dealing with what is a local problem not a systemic 1  She wanted to know my recommendation and my recommendation was that she proceed to radiation and then after that if she wishes to consider adjuvant anti-estrogens.  She is in agreement. I have sent Dr. Isidore Moos note requesting that she reschedule the patient for simulation. The patient will return to see me mid  July. Hopefully she will be done by then and we can consider anti-estrogens at that time   :Chauncey Cruel, MD   08/07/2016 3:59 PM Medical Oncology and Hematology [facility] [place], [place] [address] Tel. [phone]    Fax. [phone]

## 2016-08-11 ENCOUNTER — Ambulatory Visit
Admission: RE | Admit: 2016-08-11 | Discharge: 2016-08-11 | Disposition: A | Discharge status: Home / Self Care | Payer: 59 | Source: Ambulatory Visit | Attending: Radiation Oncology | Admitting: Radiation Oncology

## 2016-08-11 DIAGNOSIS — Z170 Estrogen receptor positive status [ER+]: Principal | ICD-10-CM

## 2016-08-11 DIAGNOSIS — C50412 Malignant neoplasm of upper-outer quadrant of left female breast: Secondary | ICD-10-CM

## 2016-08-11 DIAGNOSIS — Z510 Encounter for antineoplastic radiation therapy: Secondary | ICD-10-CM | POA: Diagnosis not present

## 2016-08-12 NOTE — Progress Notes (Signed)
Radiation Oncology         [phone] ________________________________  Name: Angelica West MRN: [id number]  Date: 08/11/2016  DOB: [birth year]  SIMULATION AND TREATMENT PLANNING NOTE / Special treatment procedure   Outpatient  DIAGNOSIS:     ICD-9-CM ICD-10-CM   1. Malignant neoplasm of upper-outer quadrant of left breast in female, estrogen receptor positive ([facility]) 174.4 C50.412    V86.0 Z17.0     NARRATIVE:  The patient was brought to the [facility].  Identity was confirmed.  All relevant records and images related to the planned course of therapy were reviewed.  The patient freely provided informed written consent to proceed with treatment after reviewing the details related to the planned course of therapy. The consent form was witnessed and verified by the simulation staff.    Then, the patient was set-up in a stable reproducible supine position for radiation therapy with her ipsilateral arm over her head, and her upper body secured in a custom-made Vac-lok device.  CT images were obtained.  Surface markings were placed.  The CT images were loaded into the planning software.    Special treatment procedure was performed today due to the extra time and effort required by myself to plan and prepare this patient for deep inspiration breath hold technique.  I have determined cardiac sparing to be of benefit to this patient to prevent long term cardiac damage due to radiation of the heart.  Bellows were placed on the patient's abdomen. To facilitate cardiac sparing, the patient was coached by the radiation therapists on breath hold techniques and breathing practice was performed. Practice waveforms were obtained. The patient was then scanned while maintaining breath hold in the treatment position.  This image was then transferred over to the imaging specialist. The imaging specialist then created a fusion of the free breathing and breath hold scans using the chest wall as the  stable structure. I personally reviewed the fusion in axial, coronal and sagittal image planes.  Excellent cardiac sparing was obtained.  I felt the patient is an appropriate candidate for breath hold and the patient will be treated as such.  The image fusion was then reviewed with the patient to reinforce the necessity of reproducible breath hold.   TREATMENT PLANNING NOTE: Treatment planning then occurred.  The radiation prescription was entered and confirmed.     A total of 3 medically necessary complex treatment devices were fabricated and supervised by me: 2 fields with MLCs for custom blocks to protect heart, and lungs;  and, a Vac-lok. MORE COMPLEX DEVICES MAY BE MADE IN DOSIMETRY FOR FIELD IN FIELD BEAMS FOR DOSE HOMOGENEITY.  I have requested : 3D Simulation which is medically necessary to give adequate dose to at risk tissues while sparing lungs and heart.  I have requested a DVH of the following structures: lungs, heart, lumpectomy cavity.    The patient will receive 40.05 Gy in 15 fractions to the left breast with 2 tangential fields.   This will  be followed by a boost.  Optical Surface Tracking Plan:  Since intensity modulated radiotherapy (IMRT) and 3D conformal radiation treatment methods are predicated on accurate and precise positioning for treatment, intrafraction motion monitoring is medically necessary to ensure accurate and safe treatment delivery. The ability to quantify intrafraction motion without excessive ionizing radiation dose can only be performed with optical surface tracking. Accordingly, surface imaging offers the opportunity to obtain 3D measurements of patient position throughout IMRT and 3D treatments without  excessive radiation exposure. I am ordering optical surface tracking for this patient's upcoming course of radiotherapy.  ________________________________   Reference:  Ursula Alert, J, et al. Surface imaging-based analysis of intrafraction  motion for breast radiotherapy patients.Journal of [place], n. 6, nov. 2014. ISSN [id number].  Available at: <http://www.jacmp.org/index.php/jacmp/article/view/4957>.    -----------------------------------  Eppie Gibson, MD

## 2016-08-13 DIAGNOSIS — Z510 Encounter for antineoplastic radiation therapy: Secondary | ICD-10-CM | POA: Diagnosis not present

## 2016-08-18 ENCOUNTER — Ambulatory Visit
Admission: RE | Admit: 2016-08-18 | Discharge: 2016-08-18 | Disposition: A | Discharge status: Home / Self Care | Payer: 59 | Source: Ambulatory Visit | Attending: Radiation Oncology | Admitting: Radiation Oncology

## 2016-08-18 DIAGNOSIS — Z510 Encounter for antineoplastic radiation therapy: Secondary | ICD-10-CM | POA: Diagnosis not present

## 2016-08-19 ENCOUNTER — Ambulatory Visit
Admission: RE | Admit: 2016-08-19 | Discharge: 2016-08-19 | Disposition: A | Discharge status: Home / Self Care | Payer: 59 | Source: Ambulatory Visit | Attending: Radiation Oncology | Admitting: Radiation Oncology

## 2016-08-19 DIAGNOSIS — Z510 Encounter for antineoplastic radiation therapy: Secondary | ICD-10-CM | POA: Diagnosis not present

## 2016-08-20 ENCOUNTER — Ambulatory Visit
Admission: RE | Admit: 2016-08-20 | Discharge: 2016-08-20 | Disposition: A | Discharge status: Home / Self Care | Payer: 59 | Source: Ambulatory Visit | Attending: Radiation Oncology | Admitting: Radiation Oncology

## 2016-08-20 DIAGNOSIS — C50412 Malignant neoplasm of upper-outer quadrant of left female breast: Secondary | ICD-10-CM

## 2016-08-20 DIAGNOSIS — Z510 Encounter for antineoplastic radiation therapy: Secondary | ICD-10-CM | POA: Diagnosis not present

## 2016-08-20 DIAGNOSIS — Z170 Estrogen receptor positive status [ER+]: Principal | ICD-10-CM

## 2016-08-20 MED ORDER — ALRA NON-METALLIC DEODORANT (RAD-ONC)
1.0000 "application " | Freq: Once | TOPICAL | Status: AC
  Administered 2016-08-20: 1 via TOPICAL

## 2016-08-20 MED ORDER — RADIAPLEXRX EX GEL
Freq: Once | CUTANEOUS | Status: AC
  Administered 2016-08-20: 16:00:00 via TOPICAL

## 2016-08-20 NOTE — Progress Notes (Signed)

## 2016-08-21 ENCOUNTER — Ambulatory Visit
Admission: RE | Admit: 2016-08-21 | Discharge: 2016-08-21 | Disposition: A | Discharge status: Home / Self Care | Payer: 59 | Source: Ambulatory Visit | Attending: Radiation Oncology | Admitting: Radiation Oncology

## 2016-08-21 DIAGNOSIS — Z510 Encounter for antineoplastic radiation therapy: Secondary | ICD-10-CM | POA: Diagnosis not present

## 2016-08-22 ENCOUNTER — Telehealth: Payer: Self-pay

## 2016-08-22 ENCOUNTER — Ambulatory Visit
Admission: RE | Admit: 2016-08-22 | Discharge: 2016-08-22 | Disposition: A | Discharge status: Home / Self Care | Payer: 59 | Source: Ambulatory Visit | Attending: Radiation Oncology | Admitting: Radiation Oncology

## 2016-08-22 DIAGNOSIS — Z510 Encounter for antineoplastic radiation therapy: Secondary | ICD-10-CM | POA: Diagnosis not present

## 2016-08-22 NOTE — Telephone Encounter (Signed)
Pt called that Dr Lin Landsman received pt information. Dr Ayesha Rumpf called pt. Dr Ayesha Rumpf is not currently pt's PCP. This RN removed Dr Ayesha Rumpf name from care team. Faxed 5/31 progress note to current PCP Dr Drema Dallas

## 2016-08-25 ENCOUNTER — Encounter: Payer: Self-pay | Admitting: Radiation Oncology

## 2016-08-25 ENCOUNTER — Ambulatory Visit
Admission: RE | Admit: 2016-08-25 | Discharge: 2016-08-25 | Disposition: A | Discharge status: Home / Self Care | Payer: 59 | Source: Ambulatory Visit | Attending: Radiation Oncology | Admitting: Radiation Oncology

## 2016-08-25 VITALS — BP 112/83 | HR 81 | Temp 98.0°F | Wt 149.4 lb

## 2016-08-25 DIAGNOSIS — Z170 Estrogen receptor positive status [ER+]: Principal | ICD-10-CM

## 2016-08-25 DIAGNOSIS — Z510 Encounter for antineoplastic radiation therapy: Secondary | ICD-10-CM | POA: Diagnosis not present

## 2016-08-25 DIAGNOSIS — C50412 Malignant neoplasm of upper-outer quadrant of left female breast: Secondary | ICD-10-CM

## 2016-08-25 NOTE — Progress Notes (Signed)
Angelica West presents for her 5th fraction of radiation to her Left Breast. She has mild Left Breast pain. She denies fatigue. She has no skin changes at this time, and is using radiaplex twice daily.  BP 112/83   Pulse 81   Temp 98 F (36.7 C)   Wt 149 lb 6.4 oz (67.8 kg)   LMP 03/10/1997   SpO2 100% Comment: room air  BMI 24.48 kg/m    Wt Readings from Last 3 Encounters:  08/25/16 149 lb 6.4 oz (67.8 kg)  08/07/16 151 lb 14.4 oz (68.9 kg)  07/29/16 148 lb 9.6 oz (67.4 kg)

## 2016-08-25 NOTE — Progress Notes (Signed)
   Weekly Management Note:  Outpatient    ICD-10-CM   1. Malignant neoplasm of upper-outer quadrant of left breast in female, estrogen receptor positive ([facility]) C50.412    Z17.0     Current Dose:  13.35 Gy  Projected Dose: 50.05 Gy   Narrative:  The patient presents for routine under treatment assessment.  CBCT/MVCT images/Port film x-rays were reviewed.  The chart was checked. Doing well.  Physical Findings:  weight is 149 lb 6.4 oz (67.8 kg). Her temperature is 98 F (36.7 C). Her blood pressure is 112/83 and her pulse is 81. Her oxygen saturation is 100%.   Wt Readings from Last 3 Encounters:  08/25/16 149 lb 6.4 oz (67.8 kg)  08/07/16 151 lb 14.4 oz (68.9 kg)  07/29/16 148 lb 9.6 oz (67.4 kg)   Mild erythema, left breast  Impression:  The patient is tolerating radiotherapy.  Plan:  Continue radiotherapy as planned. Patient instructed to apply Radiplex to intact skin in treatment fields.   ________________________________   Eppie Gibson, M.D.

## 2016-08-26 ENCOUNTER — Ambulatory Visit
Admission: RE | Admit: 2016-08-26 | Discharge: 2016-08-26 | Disposition: A | Discharge status: Home / Self Care | Payer: 59 | Source: Ambulatory Visit | Attending: Radiation Oncology | Admitting: Radiation Oncology

## 2016-08-26 DIAGNOSIS — Z510 Encounter for antineoplastic radiation therapy: Secondary | ICD-10-CM | POA: Diagnosis not present

## 2016-08-27 ENCOUNTER — Ambulatory Visit
Admission: RE | Admit: 2016-08-27 | Discharge: 2016-08-27 | Disposition: A | Discharge status: Home / Self Care | Payer: 59 | Source: Ambulatory Visit | Attending: Radiation Oncology | Admitting: Radiation Oncology

## 2016-08-27 DIAGNOSIS — Z510 Encounter for antineoplastic radiation therapy: Secondary | ICD-10-CM | POA: Diagnosis not present

## 2016-08-28 ENCOUNTER — Ambulatory Visit
Admission: RE | Admit: 2016-08-28 | Discharge: 2016-08-28 | Disposition: A | Discharge status: Home / Self Care | Payer: 59 | Source: Ambulatory Visit | Attending: Radiation Oncology | Admitting: Radiation Oncology

## 2016-08-28 DIAGNOSIS — Z510 Encounter for antineoplastic radiation therapy: Secondary | ICD-10-CM | POA: Diagnosis not present

## 2016-08-29 ENCOUNTER — Ambulatory Visit
Admission: RE | Admit: 2016-08-29 | Discharge: 2016-08-29 | Disposition: A | Discharge status: Home / Self Care | Payer: 59 | Source: Ambulatory Visit | Attending: Radiation Oncology | Admitting: Radiation Oncology

## 2016-08-29 DIAGNOSIS — Z510 Encounter for antineoplastic radiation therapy: Secondary | ICD-10-CM | POA: Diagnosis not present

## 2016-09-01 ENCOUNTER — Ambulatory Visit
Admission: RE | Admit: 2016-09-01 | Discharge: 2016-09-01 | Disposition: A | Discharge status: Home / Self Care | Payer: 59 | Source: Ambulatory Visit | Attending: Radiation Oncology | Admitting: Radiation Oncology

## 2016-09-01 DIAGNOSIS — Z510 Encounter for antineoplastic radiation therapy: Secondary | ICD-10-CM | POA: Diagnosis not present

## 2016-09-02 ENCOUNTER — Ambulatory Visit
Admission: RE | Admit: 2016-09-02 | Discharge: 2016-09-02 | Disposition: A | Discharge status: Home / Self Care | Payer: 59 | Source: Ambulatory Visit | Attending: Radiation Oncology | Admitting: Radiation Oncology

## 2016-09-02 DIAGNOSIS — Z510 Encounter for antineoplastic radiation therapy: Secondary | ICD-10-CM | POA: Diagnosis not present

## 2016-09-03 ENCOUNTER — Ambulatory Visit
Admission: RE | Admit: 2016-09-03 | Discharge: 2016-09-03 | Disposition: A | Discharge status: Home / Self Care | Payer: 59 | Source: Ambulatory Visit | Attending: Radiation Oncology | Admitting: Radiation Oncology

## 2016-09-03 DIAGNOSIS — Z510 Encounter for antineoplastic radiation therapy: Secondary | ICD-10-CM | POA: Diagnosis not present

## 2016-09-04 ENCOUNTER — Ambulatory Visit
Admission: RE | Admit: 2016-09-04 | Discharge: 2016-09-04 | Disposition: A | Discharge status: Home / Self Care | Payer: 59 | Source: Ambulatory Visit | Attending: Radiation Oncology | Admitting: Radiation Oncology

## 2016-09-04 DIAGNOSIS — Z170 Estrogen receptor positive status [ER+]: Principal | ICD-10-CM

## 2016-09-04 DIAGNOSIS — C50412 Malignant neoplasm of upper-outer quadrant of left female breast: Secondary | ICD-10-CM

## 2016-09-04 DIAGNOSIS — Z510 Encounter for antineoplastic radiation therapy: Secondary | ICD-10-CM | POA: Diagnosis not present

## 2016-09-04 MED ORDER — RADIAPLEXRX EX GEL
Freq: Once | CUTANEOUS | Status: AC
  Administered 2016-09-04: 15:00:00 via TOPICAL

## 2016-09-05 ENCOUNTER — Ambulatory Visit
Admission: RE | Admit: 2016-09-05 | Discharge: 2016-09-05 | Disposition: A | Discharge status: Home / Self Care | Payer: 59 | Source: Ambulatory Visit | Attending: Radiation Oncology | Admitting: Radiation Oncology

## 2016-09-05 DIAGNOSIS — Z510 Encounter for antineoplastic radiation therapy: Secondary | ICD-10-CM | POA: Diagnosis not present

## 2016-09-08 ENCOUNTER — Ambulatory Visit
Admission: RE | Admit: 2016-09-08 | Discharge: 2016-09-08 | Disposition: A | Discharge status: Home / Self Care | Payer: 59 | Source: Ambulatory Visit | Attending: Radiation Oncology | Admitting: Radiation Oncology

## 2016-09-08 DIAGNOSIS — Z510 Encounter for antineoplastic radiation therapy: Secondary | ICD-10-CM | POA: Diagnosis not present

## 2016-09-09 ENCOUNTER — Ambulatory Visit
Admission: RE | Admit: 2016-09-09 | Discharge: 2016-09-09 | Disposition: A | Discharge status: Home / Self Care | Payer: 59 | Source: Ambulatory Visit | Attending: Radiation Oncology | Admitting: Radiation Oncology

## 2016-09-09 DIAGNOSIS — Z510 Encounter for antineoplastic radiation therapy: Secondary | ICD-10-CM | POA: Diagnosis not present

## 2016-09-11 ENCOUNTER — Ambulatory Visit
Admission: RE | Admit: 2016-09-11 | Discharge: 2016-09-11 | Disposition: A | Discharge status: Home / Self Care | Payer: 59 | Source: Ambulatory Visit | Attending: Radiation Oncology | Admitting: Radiation Oncology

## 2016-09-11 DIAGNOSIS — Z510 Encounter for antineoplastic radiation therapy: Secondary | ICD-10-CM | POA: Diagnosis not present

## 2016-09-12 ENCOUNTER — Ambulatory Visit
Admission: RE | Admit: 2016-09-12 | Discharge: 2016-09-12 | Disposition: A | Discharge status: Home / Self Care | Payer: 59 | Source: Ambulatory Visit | Attending: Radiation Oncology | Admitting: Radiation Oncology

## 2016-09-12 DIAGNOSIS — Z510 Encounter for antineoplastic radiation therapy: Secondary | ICD-10-CM | POA: Diagnosis not present

## 2016-09-15 ENCOUNTER — Ambulatory Visit
Admission: RE | Admit: 2016-09-15 | Discharge: 2016-09-15 | Disposition: A | Discharge status: Home / Self Care | Payer: 59 | Source: Ambulatory Visit | Attending: Radiation Oncology | Admitting: Radiation Oncology

## 2016-09-15 ENCOUNTER — Ambulatory Visit: Admission: RE | Admit: 2016-09-15 | Payer: 59 | Source: Ambulatory Visit

## 2016-09-15 DIAGNOSIS — Z170 Estrogen receptor positive status [ER+]: Principal | ICD-10-CM

## 2016-09-15 DIAGNOSIS — C50412 Malignant neoplasm of upper-outer quadrant of left female breast: Secondary | ICD-10-CM

## 2016-09-15 DIAGNOSIS — Z510 Encounter for antineoplastic radiation therapy: Secondary | ICD-10-CM | POA: Diagnosis not present

## 2016-09-15 MED ORDER — RADIAPLEXRX EX GEL
Freq: Once | CUTANEOUS | Status: AC
  Administered 2016-09-15: 16:00:00 via TOPICAL

## 2016-09-16 ENCOUNTER — Ambulatory Visit
Admission: RE | Admit: 2016-09-16 | Discharge: 2016-09-16 | Disposition: A | Discharge status: Home / Self Care | Payer: 59 | Source: Ambulatory Visit | Attending: Radiation Oncology | Admitting: Radiation Oncology

## 2016-09-16 DIAGNOSIS — C50412 Malignant neoplasm of upper-outer quadrant of left female breast: Secondary | ICD-10-CM | POA: Insufficient documentation

## 2016-09-16 DIAGNOSIS — Z79811 Long term (current) use of aromatase inhibitors: Secondary | ICD-10-CM | POA: Insufficient documentation

## 2016-09-16 DIAGNOSIS — Z923 Personal history of irradiation: Secondary | ICD-10-CM | POA: Insufficient documentation

## 2016-09-16 DIAGNOSIS — Z791 Long term (current) use of non-steroidal anti-inflammatories (NSAID): Secondary | ICD-10-CM | POA: Insufficient documentation

## 2016-09-16 DIAGNOSIS — Z79891 Long term (current) use of opiate analgesic: Secondary | ICD-10-CM | POA: Insufficient documentation

## 2016-09-16 DIAGNOSIS — Z79899 Other long term (current) drug therapy: Secondary | ICD-10-CM | POA: Insufficient documentation

## 2016-09-16 DIAGNOSIS — Z510 Encounter for antineoplastic radiation therapy: Secondary | ICD-10-CM | POA: Insufficient documentation

## 2016-09-16 DIAGNOSIS — Z7982 Long term (current) use of aspirin: Secondary | ICD-10-CM | POA: Diagnosis not present

## 2016-09-16 DIAGNOSIS — Z170 Estrogen receptor positive status [ER+]: Secondary | ICD-10-CM | POA: Diagnosis not present

## 2016-09-17 ENCOUNTER — Encounter: Payer: Self-pay | Admitting: *Deleted

## 2016-09-19 ENCOUNTER — Encounter: Payer: Self-pay | Admitting: Radiation Oncology

## 2016-09-19 NOTE — Progress Notes (Signed)
  Radiation Oncology         [phone] ________________________________  Name: ZONA PEDRO MRN: [id number]  Date: 09/19/2016  DOB: 1950/09/20  End of Treatment Note  Diagnosis:   Pathologic Stage 0, [id number] Ductal Carcinoma In Situ of the Left Breast, Intermediate Grade, ER 85%, PR 5%    Indication for treatment:  Curative       Radiation treatment dates:   08/19/16 - 09/16/16  Site/dose:   1) Left Breast / 40.05 Gy in 15 fractions 2) Left Breast Boost / 10 Gy in 5 fractions  Beams/energy:  1) tangents, 3D conformal  / 10, and 6MV 2) 3 field photons / 10, and 6MV  Narrative: The patient tolerated radiation treatment relatively well. The patient experienced radiation related skin changes including very faint hyperpigmentation over the left breast. Overall she was without complaint.  Plan: The patient has completed radiation treatment. The patient will return to radiation oncology clinic for routine followup in one month. I advised them to call or return sooner if they have any questions or concerns related to their recovery or treatment.  -----------------------------------  Eppie Gibson, MD

## 2016-09-22 ENCOUNTER — Ambulatory Visit: Payer: 59 | Admitting: Oncology

## 2016-10-12 NOTE — Progress Notes (Signed)
[facility]  [phone] [phone]     ID: Angelica West DOB: 28-Apr-1950  MR#: [id number]  BTD#:[id number]  Patient Care Team: Leighton Ruff, MD as PCP - General (Family Medicine) Erroll Luna, MD as Consulting Physician (General Surgery) Briar Sword, Virgie Dad, MD as Consulting Physician (Oncology) Eppie Gibson, MD as Attending Physician (Radiation Oncology) Chauncey Cruel, MD OTHER MD:  CHIEF COMPLAINT: Ductal carcinoma in situ  CURRENT TREATMENT: Anastrozole   BREAST CANCER HISTORY: From the original intake note:  The patient had screening bilateral mammography showing an area of pleomorphic calcifications in the left breast upper outer quadrant. Diagnostic mammography of the left breast on 06/10/2016 at the [facility] showed the breast density to be category C. There was an area of grouped punctate and amorphus calcifications in the upper outer quadrant measuring up to 3.5 cm. 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  Her subsequent history is as detailed below  INTERVAL HISTORY: Angelica West returns today for follow-up and treatment of her estrogen receptor positive breast cancer. After her lumpectomy in 07/16/2016 she proceeded to adjuvant radiation, which she completed late July. She is "glad it's over" but overall tolerated it well. There was some desquamation, not too much fatigue. She was able to continue to work full time.   REVIEW OF SYSTEMS: A detailed review of systems today was otherwise stable  PAST MEDICAL HISTORY: Past Medical History:  Diagnosis Date  . Anxiety   . Hypertension     PAST SURGICAL HISTORY: Past Surgical History:  Procedure Laterality Date  . BILATERAL SALPINGONEOSTOMY, LYSIS OF ADHESIONS, R OV CYSTECTOMY    . RADIOACTIVE SEED GUIDED MASTECTOMY WITH  AXILLARY SENTINEL LYMPH NODE BIOPSY Left 07/16/2016   Procedure: RADIOACTIVE SEED GUIDED LEFT PARTIAL MASTECTOMY WITH AXILLARY SENTINEL LYMPH NODE BIOPSY;  Surgeon: Erroll Luna, MD;  Location: [facility];  Service: General;  Laterality: Left;    FAMILY HISTORY Family History  Problem Relation Age of Onset  . Hypertension Mother   . Osteoporosis Mother   . Stroke Mother        RECENTLY DIED  . Hypertension Father   . Diabetes Father   . Heart disease Father   . Cancer Father        PROSTATE  . Hypertension Sister   . Hypertension Brother   . Cancer Brother        PROSTATE  The patient's father was diagnosed with prostate cancer age 58. He died at age 84. The patient's mother died from not cancer related causes at age 28. The patient has a brother who was diagnosed with prostate cancer at age 28. She also has a sister. There is no history of ovarian cancer in the family.  GYNECOLOGIC HISTORY:  Patient's last menstrual period was 03/10/1997. Menarche age 58. The patient is GX P0. She does not recall exactly when she went through menopause. She did not take hormone replacement. She did use oral contraceptives approximately 5 years remotely with no side effects that she is aware of  SOCIAL HISTORY:  The patient is a Clinical cytogeneticist. At home it's just she and her husband Angelica West    ADVANCED DIRECTIVES: Not in place   HEALTH MAINTENANCE: Social History  Substance Use Topics  . Smoking status: Never Smoker  . Smokeless tobacco: Never Used  . Alcohol use No     Colonoscopy: 2017  PAP:  Bone density: Never   No Known Allergies  Current Outpatient Prescriptions  Medication Sig Dispense Refill  . Ascorbic Acid (VITAMIN C) 100 MG tablet Take 100 mg by mouth daily.    Marland Kitchen aspirin 81 MG tablet Take 81 mg by mouth daily.      . Calcium Carbonate-Vitamin D (CALCIUM + D PO) Take by mouth.      . Cholecalciferol (VITAMIN D PO) Take by mouth.      . co-enzyme Q-10  30 MG capsule Take 30 mg by mouth 3 (three) times daily.    . fish oil-omega-3 fatty acids 1000 MG capsule Take 2 g by mouth daily.    Marland Kitchen GARLIC PO Take by mouth.      Marland Kitchen GLUCOSAMINE PO Take by mouth.      . hydrochlorothiazide (HYDRODIURIL) 25 MG tablet Take 25 mg by mouth daily.    Marland Kitchen HYDROcodone-acetaminophen (NORCO/VICODIN) 5-325 MG tablet Take 1-2 tablets by mouth every 6 (six) hours as needed for moderate pain. (Patient not taking: Reported on 07/29/2016) 15 tablet 0  . ibuprofen (ADVIL,MOTRIN) 800 MG tablet Take 1 tablet (800 mg total) by mouth every 8 (eight) hours as needed. 30 tablet 0  . lisinopril (PRINIVIL,ZESTRIL) 10 MG tablet Take 10 mg by mouth daily.    . Multiple Vitamin (MULTIVITAMIN) capsule Take 1 capsule by mouth daily.      Marland Kitchen UNABLE TO FIND Med Name: she is taking 1/4 teaspoon daily    . zinc gluconate 50 MG tablet Take 50 mg by mouth daily.     No current facility-administered medications for this visit.     OBJECTIVE: Middle-aged African-American womanIn Who appears well  Vitals:   10/13/16 1016  BP: 122/69  Pulse: 69  Resp: 18  Temp: 98.1 F (36.7 C)     Body mass index is 24.94 kg/m.    ECOG FS:0 - Asymptomatic  Sclerae unicteric, EOMs intact Oropharynx clear and moist No cervical or supraclavicular adenopathy Lungs no rales or rhonchi Heart regular rate and rhythm Abd soft, nontender, positive bowel sounds MSK no focal spinal tenderness, no upper extremity lymphedema Neuro: nonfocal, well oriented, appropriate affect Breasts: The right breast is benign. The left breast status post lumpectomy and radiation. The hyperpigmentation is already clearing. There is no desquamation. The cosmetic result is good. Both axillae are benign.  LAB RESULTS:  CMP     Component Value Date/Time   NA 141 06/18/2016 1234   K 3.9 06/18/2016 1234   CL 100 02/25/2013 0846   CO2 28 06/18/2016 1234   GLUCOSE 89 06/18/2016 1234   BUN 16.6 06/18/2016 1234   CREATININE 1.0  06/18/2016 1234   CALCIUM 9.8 06/18/2016 1234   PROT 7.4 06/18/2016 1234   ALBUMIN 3.9 06/18/2016 1234   AST 22 06/18/2016 1234   ALT 22 06/18/2016 1234   ALKPHOS 80 06/18/2016 1234   BILITOT 0.43 06/18/2016 1234    No results found for: TOTALPROTELP, ALBUMINELP, A1GS, A2GS, BETS, BETA2SER, GAMS, MSPIKE, SPEI  No results found for: Nils Pyle, [facility]  Lab Results  Component Value Date   WBC 5.1 06/18/2016   NEUTROABS 2.7 06/18/2016   HGB 12.5 06/18/2016   HCT 37.4 06/18/2016   MCV 88.4 06/18/2016   PLT 218 06/18/2016      Chemistry      Component Value Date/Time   NA 141 06/18/2016 1234   K 3.9 06/18/2016 1234   CL 100 02/25/2013 0846   CO2 28 06/18/2016 1234  BUN 16.6 06/18/2016 1234   CREATININE 1.0 06/18/2016 1234      Component Value Date/Time   CALCIUM 9.8 06/18/2016 1234   ALKPHOS 80 06/18/2016 1234   AST 22 06/18/2016 1234   ALT 22 06/18/2016 1234   BILITOT 0.43 06/18/2016 1234       No results found for: LABCA2  No components found for: [id number]  No results for input(s): INR in the last 168 hours.  Urinalysis    Component Value Date/Time   COLORURINE YELLOW 02/25/2013 0846   APPEARANCEUR CLEAR 02/25/2013 0846   LABSPEC 1.006 02/25/2013 0846   PHURINE 6.5 02/25/2013 0846   GLUCOSEU NEG 02/25/2013 0846   HGBUR NEG 02/25/2013 0846   BILIRUBINUR NEG 02/25/2013 0846   KETONESUR NEG 02/25/2013 0846   PROTEINUR NEG 02/25/2013 0846   UROBILINOGEN 0.2 02/25/2013 0846   NITRITE NEG 02/25/2013 0846   LEUKOCYTESUR NEG 02/25/2013 0846     STUDIES: No results found.  ELIGIBLE FOR AVAILABLE RESEARCH PROTOCOL: Not in candidate for the COMET trial because of necrosis   ASSESSMENT: 66 y.o. [place] woman status post left breast upper outer quadrant biopsy 06/11/2016 showing ductal carcinoma in situ, grade 2, estrogen and progesterone receptor positive  (1) genetics testing pending  (2) status post left lumpectomy and sentinel  lymph node sampling 07/16/2016 for ductal carcinoma in situ, grade 2, with negative margins.  (3) adjuvant radiation 08/19/16 - 09/16/16 Site/dose:   1) Left Breast / 40.05 Gy in 15 fractions 2) Left Breast Boost / 10 Gy in 5 fractions  (4) to start anastrozole 11/08/2016  (a) bone density at the [facility] October 2018 pending  PLAN Jolleen has completed her local treatment for her noninvasive breast cancer and is now ready to discuss systemic therapy. She understands the benefit in terms of local recurrence will be very slow. It will bring her local recurrence rate down from about 10% to about 5%. She understands this is not a life or death issue since it if there is a local recurrence problem we would simply proceed to mastectomy.  I think a better argument for anti-estrogens is the reduction in the risk of a new breast cancer developing in either breast. That risk approaches 1% per year.  Accordingly we discussed the difference between tamoxifen and anastrozole in detail again today. She understands that anastrozole and the aromatase inhibitors in general work by blocking estrogen production. Accordingly vaginal dryness, decrease in bone density, and of course hot flashes can result. The aromatase inhibitors can also negatively affect the cholesterol profile, although that is a minor effect. One out of 5 women on aromatase inhibitors we will feel "old and achy". This arthralgia/myalgia syndrome, which resembles fibromyalgia clinically, does resolve with stopping the medications. Accordingly this is not a reason to not try an aromatase inhibitor but it is a frequent reason to stop it (in other words 20% of women will not be able to tolerate these medications).  Tamoxifen on the other hand does not block estrogen production. It does not "take away a woman's estrogen". It blocks the estrogen receptor in breast cells. Like anastrozole, it can also cause hot flashes. As opposed to anastrozole,  tamoxifen has many estrogen-like effects. It is technically an estrogen receptor modulator. This means that in some tissues tamoxifen works like estrogen-- for example it helps strengthen the bones. It tends to improve the cholesterol profile. It can cause thickening of the endometrial lining, and even endometrial polyps or rarely cancer of the uterus.(The risk  of uterine cancer due to tamoxifen is one additional cancer per thousand women year). It can cause vaginal wetness or stickiness. It can cause blood clots through this estrogen-like effect--the risk of blood clots with tamoxifen is exactly the same as with birth control pills or hormone replacement.  Neither of these agents causes mood changes or weight gain, despite the popular belief that they can have these side effects. We have data from studies comparing either of these drugs with placebo, and in those cases the control group had the same amount of weight gain and depression as the group that took the drug.  After all this discussion we decided to give anastrozole a try. She will start 11/08/2016. She will return to see me late November or early December and before that visit she will have a baseline bone density.  If she tolerates anastrozole well the plan will be to continue that for total of 5 years. If she does not we can either consider tamoxifen or more likely observation alone  She has a good understanding of this plan. She agrees with it. She knows to call for any problems that may develop before the next visit here.   :Chauncey Cruel, MD   10/13/2016 10:36 AM Medical Oncology and Hematology [facility] [place], [place] [address] Tel. [phone]    Fax. [phone]

## 2016-10-13 ENCOUNTER — Ambulatory Visit (HOSPITAL_BASED_OUTPATIENT_CLINIC_OR_DEPARTMENT_OTHER): Payer: 59 | Admitting: Oncology

## 2016-10-13 VITALS — BP 122/69 | HR 69 | Temp 98.1°F | Resp 18 | Ht 65.5 in | Wt 152.2 lb

## 2016-10-13 DIAGNOSIS — Z170 Estrogen receptor positive status [ER+]: Secondary | ICD-10-CM

## 2016-10-13 DIAGNOSIS — C50412 Malignant neoplasm of upper-outer quadrant of left female breast: Secondary | ICD-10-CM

## 2016-10-13 DIAGNOSIS — D0512 Intraductal carcinoma in situ of left breast: Secondary | ICD-10-CM | POA: Diagnosis not present

## 2016-10-13 MED ORDER — ANASTROZOLE 1 MG PO TABS: 1.0000 mg | ORAL_TABLET | Freq: Every day | ORAL | 4 refills | Status: DC

## 2016-10-28 ENCOUNTER — Encounter: Payer: Self-pay | Admitting: Radiation Oncology

## 2016-10-31 ENCOUNTER — Ambulatory Visit
Admission: RE | Admit: 2016-10-31 | Discharge: 2016-10-31 | Disposition: A | Discharge status: Home / Self Care | Payer: 59 | Source: Ambulatory Visit | Attending: Radiation Oncology | Admitting: Radiation Oncology

## 2016-10-31 ENCOUNTER — Other Ambulatory Visit: Payer: Self-pay | Admitting: *Deleted

## 2016-10-31 ENCOUNTER — Encounter: Payer: Self-pay | Admitting: Radiation Oncology

## 2016-10-31 DIAGNOSIS — Z510 Encounter for antineoplastic radiation therapy: Secondary | ICD-10-CM | POA: Diagnosis not present

## 2016-10-31 DIAGNOSIS — C50412 Malignant neoplasm of upper-outer quadrant of left female breast: Secondary | ICD-10-CM

## 2016-10-31 DIAGNOSIS — Z170 Estrogen receptor positive status [ER+]: Principal | ICD-10-CM

## 2016-10-31 HISTORY — DX: Personal history of irradiation: Z92.3

## 2016-10-31 MED ORDER — ANASTROZOLE 1 MG PO TABS: 1.0000 mg | ORAL_TABLET | Freq: Every day | ORAL | 4 refills | Status: DC

## 2016-10-31 NOTE — Progress Notes (Signed)
Radiation Oncology         [phone] ________________________________  Name: Angelica West MRN: [id number]  Date: 10/31/2016  DOB: 1950/03/12  Follow-Up Visit Note  Outpatient  CC: Leighton Ruff, MD  Magrinat, Virgie Dad, MD  Diagnosis:      ICD-10-CM   1. Malignant neoplasm of upper-outer quadrant of left breast in female, estrogen receptor positive ([place]) C50.412    Z17.0    Pathologic Stage 0, [id number] Ductal Carcinoma In Situ of the Left Breast, Intermediate Grade, ER 85%, PR 5%   Previous Radiation:  40.05 Gy in 15 fractions and 10 Gy Boost in 5 fractions Completed on 09/16/2016  Narrative:  The patient returns today for routine follow-up.  She is doing well.   She continues to be followed by medical oncology and is taking anastrozole starting 11-08-16.  No complaints today. Using Vit E oil on her left breast.                             ALLERGIES:  has No Known Allergies.  Meds: Current Outpatient Prescriptions  Medication Sig Dispense Refill  . Ascorbic Acid (VITAMIN C) 100 MG tablet Take 100 mg by mouth daily.    Marland Kitchen aspirin 81 MG tablet Take 81 mg by mouth daily.      . Calcium Carbonate-Vitamin D (CALCIUM + D PO) Take by mouth.      . Cholecalciferol (VITAMIN D PO) Take by mouth.      . co-enzyme Q-10 30 MG capsule Take 30 mg by mouth 3 (three) times daily.    . fish oil-omega-3 fatty acids 1000 MG capsule Take 2 g by mouth daily.    Marland Kitchen GARLIC PO Take by mouth.      Marland Kitchen GLUCOSAMINE PO Take by mouth.      . hydrochlorothiazide (HYDRODIURIL) 25 MG tablet Take 25 mg by mouth daily.    Marland Kitchen lisinopril (PRINIVIL,ZESTRIL) 10 MG tablet Take 10 mg by mouth daily.    . Multiple Vitamin (MULTIVITAMIN) capsule Take 1 capsule by mouth daily.      Marland Kitchen UNABLE TO FIND Med Name: she is taking 1/4 teaspoon daily    . zinc gluconate 50 MG tablet Take 50 mg by mouth daily.    Marland Kitchen anastrozole (ARIMIDEX) 1 MG tablet Take 1 tablet (1 mg total) by mouth daily. 90 tablet 4  .  HYDROcodone-acetaminophen (NORCO/VICODIN) 5-325 MG tablet Take 1-2 tablets by mouth every 6 (six) hours as needed for moderate pain. (Patient not taking: Reported on 07/29/2016) 15 tablet 0  . ibuprofen (ADVIL,MOTRIN) 800 MG tablet Take 1 tablet (800 mg total) by mouth every 8 (eight) hours as needed. (Patient not taking: Reported on 10/31/2016) 30 tablet 0   No current facility-administered medications for this encounter.     Physical Findings: The patient is in no acute distress. Patient is alert and oriented.  height is 5' 5.5" (1.664 m) and weight is 152 lb 3.2 oz (69 kg). Her temperature is 98.6 F (37 C). Her blood pressure is 107/68 and her pulse is 74. Her oxygen saturation is 98%. .Left breast - skin has healed very well with minimal hyperpigmentation.   Lab Findings: Lab Results  Component Value Date   WBC 5.1 06/18/2016   HGB 12.5 06/18/2016   HCT 37.4 06/18/2016   MCV 88.4 06/18/2016   PLT 218 06/18/2016       Radiographic Findings: No results found.  Impression/Plan: This  is a very pleasant woman with a history of left breast DCIS.  She knows to continue yearly mammography and med/onc visits. I will see her back PRN.  I encouraged her to call if she has any issues in the interim.   _____________________________________   Eppie Gibson, MD  This document serves as a record of services personally performed by Eppie Gibson, MD. It was created on her behalf by Rae Lips, a trained medical scribe. The creation of this record is based on the scribe's personal observations and the provider's statements to them. This document has been checked and approved by the attending provider.

## 2016-10-31 NOTE — Progress Notes (Signed)
Angelica West is here for follow up of radiation completed 09/16/16 to her Left Breast. She denies pain or fatigue. She reports some hyperpigmentation to her Left Breast. She does have redness to her Left nipple. She is using vitamin E oil and radiaplex to her Left Breast area. She will start anastrozole 11/08/16. She will see Dr. [name] again 02/03/17. She has a appointment for survivorship ib 12/22/16.  BP 107/68   Pulse 74   Temp 98.6 F (37 C)   Ht 5' 5.5" (1.664 m)   Wt 152 lb 3.2 oz (69 kg)   LMP 03/10/1997   SpO2 98% Comment: room air  BMI 24.94 kg/m    Wt Readings from Last 3 Encounters:  10/31/16 152 lb 3.2 oz (69 kg)  10/13/16 152 lb 3.2 oz (69 kg)  08/25/16 149 lb 6.4 oz (67.8 kg)

## 2016-12-04 ENCOUNTER — Ambulatory Visit
Admission: RE | Admit: 2016-12-04 | Discharge: 2016-12-04 | Disposition: A | Discharge status: Home / Self Care | Payer: Medicare Other | Source: Ambulatory Visit | Attending: Oncology | Admitting: Oncology

## 2016-12-04 DIAGNOSIS — Z170 Estrogen receptor positive status [ER+]: Principal | ICD-10-CM

## 2016-12-04 DIAGNOSIS — C50412 Malignant neoplasm of upper-outer quadrant of left female breast: Secondary | ICD-10-CM

## 2016-12-10 ENCOUNTER — Telehealth: Payer: Self-pay | Admitting: Oncology

## 2016-12-10 ENCOUNTER — Other Ambulatory Visit: Payer: Self-pay

## 2016-12-10 MED ORDER — ANASTROZOLE 1 MG PO TABS: 1.0000 mg | ORAL_TABLET | Freq: Every day | ORAL | 4 refills | Status: DC

## 2016-12-10 NOTE — Telephone Encounter (Signed)
Received Request to fill a prescription from [facility] on 12/09/2016.  This is located in [facility] under Media tab for viewing and printing.

## 2016-12-17 ENCOUNTER — Telehealth: Payer: Self-pay

## 2016-12-17 NOTE — Telephone Encounter (Signed)
Spoke with patient to remind her of [facility] appt on 12/22/16 at 10 am.  She was not aware that she had an appt.  Patient knows she has an appt with Dr. [name] in November and wishes to wait until then to see him and would like to cancel [facility] visit.  Will cancel visit.

## 2016-12-22 ENCOUNTER — Encounter: Payer: 59 | Admitting: Adult Health

## 2017-01-29 NOTE — Progress Notes (Signed)
[place]  [phone] [phone]     ID: Angelica West DOB: January 09, 1951  MR#: [id number]  NID#:[id number]  Patient Care Team: Leighton Ruff, MD as PCP - General (Family Medicine) Erroll Luna, MD as Consulting Physician (General Surgery) Magrinat, Virgie Dad, MD as Consulting Physician (Oncology) Eppie Gibson, MD as Attending Physician (Radiation Oncology) OTHER MD:  CHIEF COMPLAINT: Ductal carcinoma in situ  CURRENT TREATMENT: Anastrozole   BREAST CANCER HISTORY: From the original intake note:  The patient had screening bilateral mammography showing an area of pleomorphic calcifications in the left breast upper outer quadrant. Diagnostic mammography of the left breast on 06/10/2016 at the [facility] showed the breast density to be category C. There was an area of grouped punctate and amorphus calcifications in the upper outer quadrant measuring up to 3.5 cm. 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  Her subsequent history is as detailed below  INTERVAL HISTORY: Angelica West returns today for follow-up and treatment of her estrogen receptor positive breast cancer.  She continues on anastrozole, with good tolerance overall. She has an achy sensation to her bilateral legs and bilateral feet. She denies pain to her upper extremities.  It is not clear whether any of that really is related to the drug as there are no "aches" in the upper body and she did have a problem in her right leg which she had orthopedics evaluation for previously.  She denies hot flashes or a change in her typical vaginal dryness.   Since her last visit to the office, she underwent a Bone Density scan on 12/04/2016 with results showing: T-score of -0.9 at Right Total Femur.  This is normal   REVIEW OF SYSTEMS: Angelica West reports that  for exercise, she hasn't walked as much due to bilateral leg pain. She also attends a [employer], Spin cycle, and goes to the gym 5 times a week. She denies unusual headaches, visual changes, nausea, vomiting, or dizziness. There has been no unusual cough, phlegm production, or pleurisy. This been no change in bowel or bladder habits. She denies unexplained fatigue or unexplained weight loss, bleeding, rash, or fever. A detailed review of systems was otherwise stable.    PAST MEDICAL HISTORY: Past Medical History:  Diagnosis Date  . Anxiety   . History of radiation therapy 08/19/16- 09/16/16   Left Breast 40/05 Gy in 15 fractions, Left Breast Boost 10 Gy in 5 fractions  . Hypertension     PAST SURGICAL HISTORY: Past Surgical History:  Procedure Laterality Date  . BILATERAL SALPINGONEOSTOMY, LYSIS OF ADHESIONS, R OV CYSTECTOMY    . RADIOACTIVE SEED GUIDED PARTIAL MASTECTOMY WITH AXILLARY SENTINEL LYMPH NODE BIOPSY Left 07/16/2016   Procedure: RADIOACTIVE SEED GUIDED LEFT PARTIAL MASTECTOMY WITH AXILLARY SENTINEL LYMPH NODE BIOPSY;  Surgeon: Erroll Luna, MD;  Location: [facility];  Service: General;  Laterality: Left;    FAMILY HISTORY Family History  Problem Relation Age of Onset  . Hypertension Mother   . Osteoporosis Mother   . Stroke Mother        RECENTLY DIED  . Hypertension Father   . Diabetes Father   . Heart disease Father   . Cancer Father        PROSTATE  . Hypertension Sister   . Hypertension Brother   . Cancer Brother        PROSTATE  The  patient's father was diagnosed with prostate cancer age 92. He died at age 49. The patient's mother died from not cancer related causes at age 32. The patient has a brother who was diagnosed with prostate cancer at age 58. She also has a sister. There is no history of ovarian cancer in the family.  GYNECOLOGIC HISTORY:  Patient's last menstrual period was 03/10/1997. Menarche age 78. The patient is GX P0. She does not  recall exactly when she went through menopause. She did not take hormone replacement. She did use oral contraceptives approximately 5 years remotely with no side effects that she is aware of  SOCIAL HISTORY:  The patient is a Clinical cytogeneticist. At home it's just she and her husband Angelica West    ADVANCED DIRECTIVES: Not in place   HEALTH MAINTENANCE: Social History   Tobacco Use  . Smoking status: Never Smoker  . Smokeless tobacco: Never Used  Substance Use Topics  . Alcohol use: No    Alcohol/week: 0.0 oz  . Drug use: No     Colonoscopy: 2017  PAP:  Bone density: Never   No Known Allergies  Current Outpatient Medications  Medication Sig Dispense Refill  . anastrozole (ARIMIDEX) 1 MG tablet Take 1 tablet (1 mg total) by mouth daily. 90 tablet 4  . Ascorbic Acid (VITAMIN C) 100 MG tablet Take 100 mg by mouth daily.    Marland Kitchen aspirin 81 MG tablet Take 81 mg by mouth daily.      . Calcium Carbonate-Vitamin D (CALCIUM + D PO) Take by mouth.      . Cholecalciferol (VITAMIN D PO) Take by mouth.      . co-enzyme Q-10 30 MG capsule Take 30 mg by mouth 3 (three) times daily.    . fish oil-omega-3 fatty acids 1000 MG capsule Take 2 g by mouth daily.    Marland Kitchen GARLIC PO Take by mouth.      Marland Kitchen GLUCOSAMINE PO Take by mouth.      . hydrochlorothiazide (HYDRODIURIL) 25 MG tablet Take 25 mg by mouth daily.    Marland Kitchen HYDROcodone-acetaminophen (NORCO/VICODIN) 5-325 MG tablet Take 1-2 tablets by mouth every 6 (six) hours as needed for moderate pain. (Patient not taking: Reported on 07/29/2016) 15 tablet 0  . ibuprofen (ADVIL,MOTRIN) 800 MG tablet Take 1 tablet (800 mg total) by mouth every 8 (eight) hours as needed. (Patient not taking: Reported on 10/31/2016) 30 tablet 0  . lisinopril (PRINIVIL,ZESTRIL) 10 MG tablet Take 10 mg by mouth daily.    . Multiple Vitamin (MULTIVITAMIN) capsule Take 1 capsule by mouth daily.      Marland Kitchen UNABLE TO FIND Med Name: she is taking 1/4 teaspoon daily    . zinc gluconate 50 MG  tablet Take 50 mg by mouth daily.     No current facility-administered medications for this visit.     OBJECTIVE: Middle-aged African-American womanIn in no acute distress  Vitals:   02/03/17 1146  BP: 111/71  Pulse: 60  Resp: 18  Temp: (!) 97.5 F (36.4 C)  SpO2: 100%     Body mass index is 24.81 kg/m.    ECOG FS:1 - Symptomatic but completely ambulatory Sclerae unicteric, pupils round and equal Oropharynx clear and moist No cervical or supraclavicular adenopathy Lungs no rales or rhonchi Heart regular rate and rhythm Abd soft, nontender, positive bowel sounds MSK no focal spinal tenderness, no upper extremity lymphedema Neuro: nonfocal, well oriented, appropriate affect Breasts: The right breast is unremarkable.  The left  breast is status post lumpectomy followed by radiation.  There is mild hyperpigmentation remaining.  The right axilla is benign.  In the left axilla she has a slight skin tag where she had some hidradenitis in the past.  There is no evidence of disease recurrence.  LAB RESULTS:  CMP     Component Value Date/Time   NA 141 06/18/2016 1234   K 3.9 06/18/2016 1234   CL 100 02/25/2013 0846   CO2 28 06/18/2016 1234   GLUCOSE 89 06/18/2016 1234   BUN 16.6 06/18/2016 1234   CREATININE 1.0 06/18/2016 1234   CALCIUM 9.8 06/18/2016 1234   PROT 7.4 06/18/2016 1234   ALBUMIN 3.9 06/18/2016 1234   AST 22 06/18/2016 1234   ALT 22 06/18/2016 1234   ALKPHOS 80 06/18/2016 1234   BILITOT 0.43 06/18/2016 1234    No results found for: TOTALPROTELP, ALBUMINELP, A1GS, A2GS, BETS, BETA2SER, GAMS, MSPIKE, SPEI  No results found for: Nils Pyle, [facility]  Lab Results  Component Value Date   WBC 5.1 06/18/2016   NEUTROABS 2.7 06/18/2016   HGB 12.5 06/18/2016   HCT 37.4 06/18/2016   MCV 88.4 06/18/2016   PLT 218 06/18/2016      Chemistry      Component Value Date/Time   NA 141 06/18/2016 1234   K 3.9 06/18/2016 1234   CL 100 02/25/2013 0846     CO2 28 06/18/2016 1234   BUN 16.6 06/18/2016 1234   CREATININE 1.0 06/18/2016 1234      Component Value Date/Time   CALCIUM 9.8 06/18/2016 1234   ALKPHOS 80 06/18/2016 1234   AST 22 06/18/2016 1234   ALT 22 06/18/2016 1234   BILITOT 0.43 06/18/2016 1234       No results found for: LABCA2  No components found for: [id number]  No results for input(s): INR in the last 168 hours.  Urinalysis    Component Value Date/Time   COLORURINE YELLOW 02/25/2013 0846   APPEARANCEUR CLEAR 02/25/2013 0846   LABSPEC 1.006 02/25/2013 0846   PHURINE 6.5 02/25/2013 0846   GLUCOSEU NEG 02/25/2013 0846   HGBUR NEG 02/25/2013 0846   BILIRUBINUR NEG 02/25/2013 0846   KETONESUR NEG 02/25/2013 0846   PROTEINUR NEG 02/25/2013 0846   UROBILINOGEN 0.2 02/25/2013 0846   NITRITE NEG 02/25/2013 0846   LEUKOCYTESUR NEG 02/25/2013 0846     STUDIES: Since her last visit to the office, she underwent a Bone Density scan on 12/04/2016 with results showing: T-score of -0.9 at Right Total Femur.   ELIGIBLE FOR AVAILABLE RESEARCH PROTOCOL: Not in candidate for the COMET trial because of necrosis   ASSESSMENT: 66 y.o. [place] woman status post left breast upper outer quadrant biopsy 06/11/2016 showing ductal carcinoma in situ, grade 2, estrogen and progesterone receptor positive  (1) genetics testing pending  (2) status post left lumpectomy and sentinel lymph node sampling 07/16/2016 for ductal carcinoma in situ, grade 2, with negative margins.  (3) adjuvant radiation 08/19/16 - 09/16/16 Site/dose:   1) Left Breast / 40.05 Gy in 15 fractions 2) Left Breast Boost / 10 Gy in 5 fractions  (4) started anastrozole 11/08/2016  (a) [facility] density [facility] October 2018 pending  PLAN Tenzin is tolerating the anastrozole generally well.  I suspect that the problem in her feet and legs are not related to this drug.  Of course the drug could be making the problems worse.  I suggested that she get a new  pair of shoes, and consider  further orthopedic evaluation.  She is already engaged in vigorous exercise program but perhaps some physical therapy to the legs would be helpful or perhaps a different set of exercises would be a total.  In any case I do not think we want to stop the anastrozole at this point.  She is going to see me again in 3 months just to make sure she is tolerating it okay at that point.  If not we will switch to tamoxifen.  Ideally however we would continue anastrozole for a total of 5 years.  She knows to call for any problems that may develop before the next visit.   Magrinat, Virgie Dad, MD  02/03/17 12:09 PM Medical Oncology and Hematology [facility] [address] [place], [place] [address] Tel. [phone]    Fax. [phone]    This document serves as a record of services personally performed by Lurline Del, MD. It was created on his behalf by Steva Colder, a trained medical scribe. The creation of this record is based on the scribe's personal observations and the provider's statements to them.   I have reviewed the above documentation for accuracy and completeness, and I agree with the above.

## 2017-02-03 ENCOUNTER — Ambulatory Visit (HOSPITAL_BASED_OUTPATIENT_CLINIC_OR_DEPARTMENT_OTHER): Payer: Medicare Other | Admitting: Oncology

## 2017-02-03 ENCOUNTER — Telehealth: Payer: Self-pay | Admitting: Oncology

## 2017-02-03 VITALS — BP 111/71 | HR 60 | Temp 97.5°F | Resp 18 | Ht 65.5 in | Wt 151.4 lb

## 2017-02-03 DIAGNOSIS — C50412 Malignant neoplasm of upper-outer quadrant of left female breast: Secondary | ICD-10-CM

## 2017-02-03 DIAGNOSIS — Z170 Estrogen receptor positive status [ER+]: Secondary | ICD-10-CM

## 2017-02-03 DIAGNOSIS — D0512 Intraductal carcinoma in situ of left breast: Secondary | ICD-10-CM | POA: Diagnosis present

## 2017-02-03 NOTE — Telephone Encounter (Signed)
Scheduled appt per 11/27 los - f/u in 3 months - sent reminder letter in the mail.

## 2017-05-07 ENCOUNTER — Ambulatory Visit: Payer: Medicare Other | Admitting: Oncology

## 2017-05-29 ENCOUNTER — Other Ambulatory Visit: Payer: Self-pay | Admitting: Oncology

## 2017-05-29 DIAGNOSIS — Z9889 Other specified postprocedural states: Secondary | ICD-10-CM

## 2017-06-10 ENCOUNTER — Ambulatory Visit
Admission: RE | Admit: 2017-06-10 | Discharge: 2017-06-10 | Disposition: A | Discharge status: Home / Self Care | Payer: 59 | Source: Ambulatory Visit | Attending: Oncology | Admitting: Oncology

## 2017-06-10 DIAGNOSIS — Z9889 Other specified postprocedural states: Secondary | ICD-10-CM

## 2017-06-10 HISTORY — DX: Personal history of irradiation: Z92.3

## 2017-06-26 ENCOUNTER — Telehealth: Payer: Self-pay | Admitting: Oncology

## 2017-06-26 NOTE — Telephone Encounter (Signed)
Patient called to cancel °

## 2017-06-30 ENCOUNTER — Ambulatory Visit: Payer: Medicare Other | Admitting: Oncology

## 2017-11-19 ENCOUNTER — Other Ambulatory Visit: Payer: Self-pay | Admitting: Oncology

## 2018-02-12 ENCOUNTER — Other Ambulatory Visit: Payer: Self-pay | Admitting: Oncology

## 2018-03-31 NOTE — H&P (Signed)
TOTAL HIP ADMISSION H&P  Patient is admitted for right total hip arthroplasty, anterior approach.  Subjective:  Chief Complaint:   Right hip primary OA / pain  HPI: Angelica West, 68 y.o. female, has a history of pain and functional disability in the right hip due to arthritis and patient has failed non-surgical conservative treatments for greater than 12 weeks to include NSAID's and/or analgesics and activity modification.  Onset of symptoms was gradual starting 2+ years ago with gradually worsening course since that time.The patient noted no past surgery on the right hip(s).  Patient currently rates pain in the right hip at 4 out of 10 with activity. Patient has night pain, worsening of pain with activity and weight bearing, trendelenberg gait, pain that interfers with activities of daily living and pain with passive range of motion. Patient has evidence of periarticular osteophytes and joint space narrowing by imaging studies. This condition presents safety issues increasing the risk of falls.  There is no current active infection.  Risks, benefits and expectations were discussed with the patient.  Risks including but not limited to the risk of anesthesia, blood clots, nerve damage, blood vessel damage, failure of the prosthesis, infection and up to and including death.  Patient understand the risks, benefits and expectations and wishes to proceed with surgery.   PCP: Leighton Ruff, MD  D/C Plans:       Home   Post-op Meds:       No Rx given   Tranexamic Acid:      To be given - IV   Decadron:      Is to be given  FYI:      ASA  Norco  DME:   Rx given for - RW & 3-n-1  PT:   No PT    Patient Active Problem List   Diagnosis Date Noted  . Malignant neoplasm of upper-outer quadrant of left breast in female, estrogen receptor positive ([place]) 06/17/2016  . Hypertension 02/20/2012   Past Medical History:  Diagnosis Date  . Anxiety   . History of radiation therapy 08/19/16-  09/16/16   Left Breast 40/05 Gy in 15 fractions, Left Breast Boost 10 Gy in 5 fractions  . Hypertension   . Personal history of radiation therapy     Past Surgical History:  Procedure Laterality Date  . BILATERAL SALPINGONEOSTOMY, LYSIS OF ADHESIONS, R OV CYSTECTOMY    . BREAST LUMPECTOMY Left 07/16/2016  . RADIOACTIVE SEED GUIDED PARTIAL MASTECTOMY WITH AXILLARY SENTINEL LYMPH NODE BIOPSY Left 07/16/2016   Procedure: RADIOACTIVE SEED GUIDED LEFT PARTIAL MASTECTOMY WITH AXILLARY SENTINEL LYMPH NODE BIOPSY;  Surgeon: Erroll Luna, MD;  Location: [place];  Service: General;  Laterality: Left;    No current facility-administered medications for this encounter.    Current Outpatient Medications  Medication Sig Dispense Refill Last Dose  . anastrozole (ARIMIDEX) 1 MG tablet TAKE 1 TABLET BY MOUTH  DAILY (Patient taking differently: Take 1 mg by mouth daily. ) 90 tablet 0   . Ascorbic Acid (VITAMIN C) 1000 MG tablet Take 1,000 mg by mouth daily.     Marland Kitchen aspirin 81 MG tablet Take 81 mg by mouth daily.     Taking  . Calcium Carb-Cholecalciferol (CALCIUM 600 + D PO) Take 1 tablet by mouth daily.     . calcium carbonate (OS-CAL - DOSED IN MG OF ELEMENTAL CALCIUM) 1250 (500 Ca) MG tablet Take 1 tablet by mouth daily.     . Cholecalciferol (VITAMIN D)  50 MCG (2000 UT) tablet Take 4,000 Units by mouth daily.     . Coenzyme Q10 300 MG CAPS Take 300 mg by mouth daily.     Marland Kitchen GARLIC PO Take 1,610 mg by mouth daily.    Taking  . Glucosamine HCl-MSM (GLUCOSAMINE-MSM PO) Take 2 tablets by mouth daily.     . hydrochlorothiazide (HYDRODIURIL) 25 MG tablet Take 25 mg by mouth daily.   Taking  . lisinopril (PRINIVIL,ZESTRIL) 20 MG tablet Take 20 mg by mouth daily.     . Multiple Vitamin (MULTIVITAMIN WITH MINERALS) TABS tablet Take 1-2 tablets by mouth daily.     . Omega-3 Fatty Acids (FISH OIL) 1000 MG CAPS Take 1 capsule by mouth every other day.     . TURMERIC PO Take 1 tablet by mouth daily.      Marland Kitchen zinc gluconate 50 MG tablet Take 50 mg by mouth daily.   Taking   No Known Allergies   Social History   Tobacco Use  . Smoking status: Never Smoker  . Smokeless tobacco: Never Used  Substance Use Topics  . Alcohol use: No    Alcohol/week: 0.0 standard drinks    Family History  Problem Relation Age of Onset  . Hypertension Mother   . Osteoporosis Mother   . Stroke Mother        RECENTLY DIED  . Hypertension Father   . Diabetes Father   . Heart disease Father   . Cancer Father        PROSTATE  . Hypertension Sister   . Hypertension Brother   . Cancer Brother        PROSTATE     Review of Systems  Constitutional: Negative.   HENT: Negative.   Eyes: Negative.   Respiratory: Negative.   Cardiovascular: Negative.   Gastrointestinal: Negative.   Genitourinary: Negative.   Musculoskeletal: Positive for joint pain.  Skin: Negative.   Neurological: Negative.   Endo/Heme/Allergies: Negative.   Psychiatric/Behavioral: Negative.     Objective:  Physical Exam  Constitutional: She is oriented to person, place, and time. She appears well-developed.  HENT:  Head: Normocephalic.  Eyes: Pupils are equal, round, and reactive to light.  Neck: Neck supple. No JVD present. No tracheal deviation present. No thyromegaly present.  Cardiovascular: Normal rate, regular rhythm and intact distal pulses.  Respiratory: Effort normal and breath sounds normal. No respiratory distress. She has no wheezes.  GI: Soft. There is no abdominal tenderness. There is no guarding.  Musculoskeletal:     Right hip: She exhibits decreased range of motion, decreased strength, tenderness and bony tenderness. She exhibits no swelling, no deformity and no laceration.  Lymphadenopathy:    She has no cervical adenopathy.  Neurological: She is alert and oriented to person, place, and time.  Skin: Skin is warm and dry.  Psychiatric: She has a normal mood and affect.      Labs:  Estimated body  mass index is 24.81 kg/m as calculated from the following:   Height as of 02/03/17: 5' 5.5" (1.664 m).   Weight as of 02/03/17: 68.7 kg.   Imaging Review Plain radiographs demonstrate severe degenerative joint disease of the right hip. The bone quality appears to be good for age and reported activity level.    Preoperative templating of the joint replacement has been completed, documented, and submitted to the Operating Room personnel in order to optimize intra-operative equipment management.    Assessment/Plan:  End stage arthritis, right hip  The  patient history, physical examination, clinical judgement of the provider and imaging studies are consistent with end stage degenerative joint disease of the right hip and total hip arthroplasty is deemed medically necessary. The treatment options including medical management, injection therapy, arthroscopy and arthroplasty were discussed at length. The risks and benefits of total hip arthroplasty were presented and reviewed. The risks due to aseptic loosening, infection, stiffness, dislocation/subluxation,  thromboembolic complications and other imponderables were discussed.  The patient acknowledged the explanation, agreed to proceed with the plan and consent was signed. Patient is being admitted for inpatient treatment for surgery, pain control, PT, OT, prophylactic antibiotics, VTE prophylaxis, progressive ambulation and ADL's and discharge planning.The patient is planning to be discharged home.     West Pugh Lasheka Kempner   PA-C  03/31/2018, 1:57 PM

## 2018-04-06 ENCOUNTER — Other Ambulatory Visit: Payer: Self-pay

## 2018-04-06 ENCOUNTER — Encounter (HOSPITAL_COMMUNITY): Payer: Self-pay

## 2018-04-06 ENCOUNTER — Encounter (HOSPITAL_COMMUNITY)
Admission: RE | Admit: 2018-04-06 | Discharge: 2018-04-06 | Disposition: A | Discharge status: Home / Self Care | Payer: 59 | Source: Ambulatory Visit | Attending: Orthopedic Surgery | Admitting: Orthopedic Surgery

## 2018-04-06 DIAGNOSIS — Z01812 Encounter for preprocedural laboratory examination: Secondary | ICD-10-CM | POA: Diagnosis present

## 2018-04-06 DIAGNOSIS — I10 Essential (primary) hypertension: Secondary | ICD-10-CM | POA: Diagnosis not present

## 2018-04-06 DIAGNOSIS — M1611 Unilateral primary osteoarthritis, right hip: Secondary | ICD-10-CM | POA: Diagnosis not present

## 2018-04-06 HISTORY — DX: Prediabetes: R73.03

## 2018-04-06 LAB — BASIC METABOLIC PANEL
Anion gap: 10 (ref 5–15)
BUN: 17 mg/dL (ref 8–23)
CO2: 25 mmol/L (ref 22–32)
Calcium: 9.6 mg/dL (ref 8.9–10.3)
Chloride: 101 mmol/L (ref 98–111)
Creatinine, Ser: 0.89 mg/dL (ref 0.44–1.00)
GFR calc Af Amer: >60 mL/min (ref >60)
GFR calc non Af Amer: >60 mL/min (ref >60)
Glucose, Bld: 86 mg/dL (ref 70–99)
POTASSIUM: 3.9 mmol/L (ref 3.5–5.1)
Sodium: 136 mmol/L (ref 135–145)

## 2018-04-06 LAB — SURGICAL PCR SCREEN
MRSA, PCR: NEGATIVE
STAPHYLOCOCCUS AUREUS: NEGATIVE

## 2018-04-06 LAB — CBC
HCT: 40.8 % (ref 36.0–46.0)
Hemoglobin: 13 g/dL (ref 12.0–15.0)
MCH: 29.3 pg (ref 26.0–34.0)
MCHC: 31.9 g/dL (ref 30.0–36.0)
MCV: 92.1 fL (ref 80.0–100.0)
Platelets: 238 10*3/uL (ref 150–400)
RBC: 4.43 MIL/uL (ref 3.87–5.11)
RDW: 13.8 % (ref 11.5–15.5)
WBC: 4.4 10*3/uL (ref 4.0–10.5)
nRBC: 0 % (ref 0.0–0.2)

## 2018-04-06 LAB — ABO/RH: ABO/RH(D): B POS

## 2018-04-06 NOTE — Patient Instructions (Signed)
Angelica West  04/06/2018   Your procedure is scheduled on: 04-13-2018   Report to [facility] Main  Entrance    Report to admitting at 9:00AM    Call this number if you have problems the morning of surgery [phone]   Remember: Do not eat food or drink liquids :After Midnight. BRUSH YOUR TEETH MORNING OF SURGERY AND RINSE YOUR MOUTH OUT, NO CHEWING GUM CANDY OR MINTS.     Take these medicines the morning of surgery with A SIP OF WATER: none                                You may not have any metal on your body including hair pins and              piercings  Do not wear jewelry, make-up, lotions, powders or perfumes, deodorant             Do not wear nail polish.  Do not shave  48 hours prior to surgery.               Do not bring valuables to the hospital. [place].  Contacts, dentures or bridgework may not be worn into surgery.  Leave suitcase in the car. After surgery it may be brought to your room.                   Please read over the following fact sheets you were given: _____________________________________________________________________             [facility] - Preparing for Surgery Before surgery, you can play an important role.  Because skin is not sterile, your skin needs to be as free of germs as possible.  You can reduce the number of germs on your skin by washing with CHG (chlorahexidine gluconate) soap before surgery.  CHG is an antiseptic cleaner which kills germs and bonds with the skin to continue killing germs even after washing. Please DO NOT use if you have an allergy to CHG or antibacterial soaps.  If your skin becomes reddened/irritated stop using the CHG and inform your nurse when you arrive at Short Stay. Do not shave (including legs and underarms) for at least 48 hours prior to the first CHG shower.  You may shave your face/neck. Please follow these instructions  carefully:  1.  Shower with CHG Soap the night before surgery and the  morning of Surgery.  2.  If you choose to wash your hair, wash your hair first as usual with your  normal  shampoo.  3.  After you shampoo, rinse your hair and body thoroughly to remove the  shampoo.                           4.  Use CHG as you would any other liquid soap.  You can apply chg directly  to the skin and wash                       Gently with a scrungie or clean washcloth.  5.  Apply the CHG Soap to your body ONLY FROM THE NECK DOWN.   Do not  use on face/ open                           Wound or open sores. Avoid contact with eyes, ears mouth and genitals (private parts).                       Wash face,  Genitals (private parts) with your normal soap.             6.  Wash thoroughly, paying special attention to the area where your surgery  will be performed.  7.  Thoroughly rinse your body with warm water from the neck down.  8.  DO NOT shower/wash with your normal soap after using and rinsing off  the CHG Soap.                9.  Pat yourself dry with a clean towel.            10.  Wear clean pajamas.            11.  Place clean sheets on your bed the night of your first shower and do not  sleep with pets. Day of Surgery : Do not apply any lotions/deodorants the morning of surgery.  Please wear clean clothes to the hospital/surgery center.  FAILURE TO FOLLOW THESE INSTRUCTIONS MAY RESULT IN THE CANCELLATION OF YOUR SURGERY PATIENT SIGNATURE_________________________________  NURSE SIGNATURE__________________________________  ________________________________________________________________________   Adam Phenix  An incentive spirometer is a tool that can help keep your lungs clear and active. This tool measures how well you are filling your lungs with each breath. Taking long deep breaths may help reverse or decrease the chance of developing breathing (pulmonary) problems (especially infection)  following:  A long period of time when you are unable to move or be active. BEFORE THE PROCEDURE   If the spirometer includes an indicator to show your best effort, your nurse or respiratory therapist will set it to a desired goal.  If possible, sit up straight or lean slightly forward. Try not to slouch.  Hold the incentive spirometer in an upright position. INSTRUCTIONS FOR USE  1. Sit on the edge of your bed if possible, or sit up as far as you can in bed or on a chair. 2. Hold the incentive spirometer in an upright position. 3. Breathe out normally. 4. Place the mouthpiece in your mouth and seal your lips tightly around it. 5. Breathe in slowly and as deeply as possible, raising the piston or the ball toward the top of the column. 6. Hold your breath for 3-5 seconds or for as long as possible. Allow the piston or ball to fall to the bottom of the column. 7. Remove the mouthpiece from your mouth and breathe out normally. 8. Rest for a few seconds and repeat Steps 1 through 7 at least 10 times every 1-2 hours when you are awake. Take your time and take a few normal breaths between deep breaths. 9. The spirometer may include an indicator to show your best effort. Use the indicator as a goal to work toward during each repetition. 10. After each set of 10 deep breaths, practice coughing to be sure your lungs are clear. If you have an incision (the cut made at the time of surgery), support your incision when coughing by placing a pillow or rolled up towels firmly against it. Once you are able to get out of bed, walk  around indoors and cough well. You may stop using the incentive spirometer when instructed by your caregiver.  RISKS AND COMPLICATIONS  Take your time so you do not get dizzy or light-headed.  If you are in pain, you may need to take or ask for pain medication before doing incentive spirometry. It is harder to take a deep breath if you are having pain. AFTER USE  Rest and  breathe slowly and easily.  It can be helpful to keep track of a log of your progress. Your caregiver can provide you with a simple table to help with this. If you are using the spirometer at home, follow these instructions: [place] IF:   You are having difficultly using the spirometer.  You have trouble using the spirometer as often as instructed.  Your pain medication is not giving enough relief while using the spirometer.  You develop fever of 100.5 F (38.1 C) or higher. SEEK IMMEDIATE MEDICAL CARE IF:   You cough up bloody sputum that had not been present before.  You develop fever of 102 F (38.9 C) or greater.  You develop worsening pain at or near the incision site. MAKE SURE YOU:   Understand these instructions.  Will watch your condition.  Will get help right away if you are not doing well or get worse. Document Released: 07/07/2006 Document Revised: 05/19/2011 Document Reviewed: 09/07/2006 Exit[employer] Patient Information 2014 Exit[employer], [place].   ________________________________________________________________________  WHAT IS A BLOOD TRANSFUSION? Blood Transfusion Information  A transfusion is the replacement of blood or some of its parts. Blood is made up of multiple cells which provide different functions.  Red blood cells carry oxygen and are used for blood loss replacement.  White blood cells fight against infection.  Platelets control bleeding.  Plasma helps clot blood.  Other blood products are available for specialized needs, such as hemophilia or other clotting disorders. BEFORE THE TRANSFUSION  Who gives blood for transfusions?   Healthy volunteers who are fully evaluated to make sure their blood is safe. This is blood bank blood. Transfusion therapy is the safest it has ever been in the practice of medicine. Before blood is taken from a donor, a complete history is taken to make sure that person has no history of diseases nor engages in  risky social behavior (examples are intravenous drug use or sexual activity with multiple partners). The donor's travel history is screened to minimize risk of transmitting infections, such as malaria. The donated blood is tested for signs of infectious diseases, such as HIV and hepatitis. The blood is then tested to be sure it is compatible with you in order to minimize the chance of a transfusion reaction. If you or a relative donates blood, this is often done in anticipation of surgery and is not appropriate for emergency situations. It takes many days to process the donated blood. RISKS AND COMPLICATIONS Although transfusion therapy is very safe and saves many lives, the main dangers of transfusion include:   Getting an infectious disease.  Developing a transfusion reaction. This is an allergic reaction to something in the blood you were given. Every precaution is taken to prevent this. The decision to have a blood transfusion has been considered carefully by your caregiver before blood is given. Blood is not given unless the benefits outweigh the risks. AFTER THE TRANSFUSION  Right after receiving a blood transfusion, you will usually feel much better and more energetic. This is especially true if your red blood cells have  gotten low (anemic). The transfusion raises the level of the red blood cells which carry oxygen, and this usually causes an energy increase.  The nurse administering the transfusion will monitor you carefully for complications. HOME CARE INSTRUCTIONS  No special instructions are needed after a transfusion. You may find your energy is better. Speak with your caregiver about any limitations on activity for underlying diseases you may have. SEEK MEDICAL CARE IF:   Your condition is not improving after your transfusion.  You develop redness or irritation at the intravenous (IV) site. SEEK IMMEDIATE MEDICAL CARE IF:  Any of the following symptoms occur over the next 12  hours:  Shaking chills.  You have a temperature by mouth above 102 F (38.9 C), not controlled by medicine.  Chest, back, or muscle pain.  People around you feel you are not acting correctly or are confused.  Shortness of breath or difficulty breathing.  Dizziness and fainting.  You get a rash or develop hives.  You have a decrease in urine output.  Your urine turns a dark color or changes to pink, red, or brown. Any of the following symptoms occur over the next 10 days:  You have a temperature by mouth above 102 F (38.9 C), not controlled by medicine.  Shortness of breath.  Weakness after normal activity.  The white part of the eye turns yellow (jaundice).  You have a decrease in the amount of urine or are urinating less often.  Your urine turns a dark color or changes to pink, red, or brown. Document Released: 02/22/2000 Document Revised: 05/19/2011 Document Reviewed: 10/11/2007 [facility] Patient Information 2014 [place], [place].  _______________________________________________________________________

## 2018-04-06 NOTE — Progress Notes (Signed)
   Surgical clearance Dr Leighton Ruff  On chart   ekg 03-26-2018 on chart from [facility] physicians   [id number] 03-02-18 on chart from [facility] physicians

## 2018-04-13 ENCOUNTER — Other Ambulatory Visit: Payer: Self-pay

## 2018-04-13 ENCOUNTER — Inpatient Hospital Stay (HOSPITAL_COMMUNITY): Payer: 59 | Admitting: Physician Assistant

## 2018-04-13 ENCOUNTER — Inpatient Hospital Stay (HOSPITAL_COMMUNITY)
Admission: RE | Admit: 2018-04-13 | Discharge: 2018-04-14 | DRG: 470 | Disposition: A | Discharge status: Home Health | Payer: 59 | Attending: Orthopedic Surgery | Admitting: Orthopedic Surgery

## 2018-04-13 ENCOUNTER — Inpatient Hospital Stay (HOSPITAL_COMMUNITY): Payer: 59

## 2018-04-13 ENCOUNTER — Inpatient Hospital Stay (HOSPITAL_COMMUNITY): Payer: 59 | Admitting: Certified Registered Nurse Anesthetist

## 2018-04-13 ENCOUNTER — Encounter (HOSPITAL_COMMUNITY): Payer: Self-pay | Admitting: Certified Registered Nurse Anesthetist

## 2018-04-13 ENCOUNTER — Encounter (HOSPITAL_COMMUNITY)
Admission: RE | Disposition: A | Discharge status: Home Health | Payer: Self-pay | Source: Home / Self Care | Attending: Orthopedic Surgery

## 2018-04-13 DIAGNOSIS — Z96641 Presence of right artificial hip joint: Secondary | ICD-10-CM

## 2018-04-13 DIAGNOSIS — Z833 Family history of diabetes mellitus: Secondary | ICD-10-CM

## 2018-04-13 DIAGNOSIS — Z170 Estrogen receptor positive status [ER+]: Secondary | ICD-10-CM

## 2018-04-13 DIAGNOSIS — Z7982 Long term (current) use of aspirin: Secondary | ICD-10-CM | POA: Diagnosis not present

## 2018-04-13 DIAGNOSIS — Z8249 Family history of ischemic heart disease and other diseases of the circulatory system: Secondary | ICD-10-CM

## 2018-04-13 DIAGNOSIS — M1611 Unilateral primary osteoarthritis, right hip: Principal | ICD-10-CM | POA: Diagnosis present

## 2018-04-13 DIAGNOSIS — Z79899 Other long term (current) drug therapy: Secondary | ICD-10-CM | POA: Diagnosis not present

## 2018-04-13 DIAGNOSIS — Z8042 Family history of malignant neoplasm of prostate: Secondary | ICD-10-CM | POA: Diagnosis not present

## 2018-04-13 DIAGNOSIS — Z853 Personal history of malignant neoplasm of breast: Secondary | ICD-10-CM | POA: Diagnosis not present

## 2018-04-13 DIAGNOSIS — I10 Essential (primary) hypertension: Secondary | ICD-10-CM | POA: Diagnosis present

## 2018-04-13 DIAGNOSIS — Z8262 Family history of osteoporosis: Secondary | ICD-10-CM

## 2018-04-13 DIAGNOSIS — Z823 Family history of stroke: Secondary | ICD-10-CM

## 2018-04-13 DIAGNOSIS — Z96649 Presence of unspecified artificial hip joint: Secondary | ICD-10-CM

## 2018-04-13 DIAGNOSIS — Z923 Personal history of irradiation: Secondary | ICD-10-CM

## 2018-04-13 DIAGNOSIS — Z419 Encounter for procedure for purposes other than remedying health state, unspecified: Secondary | ICD-10-CM

## 2018-04-13 HISTORY — PX: TOTAL HIP ARTHROPLASTY: SHX124

## 2018-04-13 LAB — TYPE AND SCREEN
ABO/RH(D): B POS
Antibody Screen: NEGATIVE

## 2018-04-13 SURGERY — ARTHROPLASTY, HIP, TOTAL, ANTERIOR APPROACH
Anesthesia: Spinal | Site: Hip | Laterality: Right

## 2018-04-13 MED ORDER — DEXAMETHASONE SODIUM PHOSPHATE 10 MG/ML IJ SOLN: 10.0000 mg | Freq: Once | INTRAMUSCULAR | Status: DC

## 2018-04-13 MED ORDER — POLYETHYLENE GLYCOL 3350 17 G PO PACK: 17.0000 g | PACK | Freq: Two times a day (BID) | ORAL | 0 refills | Status: DC

## 2018-04-13 MED ORDER — MIDAZOLAM HCL 2 MG/2ML IJ SOLN
INTRAMUSCULAR | Status: AC
  Filled 2018-04-13: qty 2

## 2018-04-13 MED ORDER — MIDAZOLAM HCL 5 MG/5ML IJ SOLN
INTRAMUSCULAR | Status: DC | PRN
  Administered 2018-04-13: 2 mg via INTRAVENOUS

## 2018-04-13 MED ORDER — FERROUS SULFATE 325 (65 FE) MG PO TABS
325.0000 mg | ORAL_TABLET | Freq: Three times a day (TID) | ORAL | Status: DC
  Administered 2018-04-13 – 2018-04-14 (×3): 325 mg via ORAL
  Filled 2018-04-13 (×3): qty 1

## 2018-04-13 MED ORDER — PROPOFOL 500 MG/50ML IV EMUL
INTRAVENOUS | Status: DC | PRN
  Administered 2018-04-13: 75 ug/kg/min via INTRAVENOUS

## 2018-04-13 MED ORDER — CHLORHEXIDINE GLUCONATE 4 % EX LIQD: 60.0000 mL | Freq: Once | CUTANEOUS | Status: DC

## 2018-04-13 MED ORDER — DEXAMETHASONE SODIUM PHOSPHATE 10 MG/ML IJ SOLN
INTRAMUSCULAR | Status: DC | PRN
  Administered 2018-04-13: 10 mg via INTRAVENOUS

## 2018-04-13 MED ORDER — PROPOFOL 10 MG/ML IV BOLUS
INTRAVENOUS | Status: DC | PRN
  Administered 2018-04-13: 20 mg via INTRAVENOUS

## 2018-04-13 MED ORDER — CEFAZOLIN SODIUM-DEXTROSE 2-4 GM/100ML-% IV SOLN
2.0000 g | Freq: Four times a day (QID) | INTRAVENOUS | Status: AC
  Administered 2018-04-13 (×2): 2 g via INTRAVENOUS
  Filled 2018-04-13 (×2): qty 100

## 2018-04-13 MED ORDER — CELECOXIB 200 MG PO CAPS
200.0000 mg | ORAL_CAPSULE | Freq: Two times a day (BID) | ORAL | Status: DC
  Administered 2018-04-14: 200 mg via ORAL
  Filled 2018-04-13: qty 1

## 2018-04-13 MED ORDER — BUPIVACAINE IN DEXTROSE 0.75-8.25 % IT SOLN
INTRATHECAL | Status: DC | PRN
  Administered 2018-04-13: 1.6 mL via INTRATHECAL

## 2018-04-13 MED ORDER — SODIUM CHLORIDE 0.9 % IR SOLN
Status: DC | PRN
  Administered 2018-04-13: 1000 mL

## 2018-04-13 MED ORDER — DOCUSATE SODIUM 100 MG PO CAPS: 100.0000 mg | ORAL_CAPSULE | Freq: Two times a day (BID) | ORAL | 0 refills | Status: DC

## 2018-04-13 MED ORDER — EPHEDRINE 5 MG/ML INJ
INTRAVENOUS | Status: AC
  Filled 2018-04-13: qty 10

## 2018-04-13 MED ORDER — HYDROCHLOROTHIAZIDE 25 MG PO TABS
25.0000 mg | ORAL_TABLET | Freq: Every day | ORAL | Status: DC
  Administered 2018-04-13: 25 mg via ORAL
  Filled 2018-04-13: qty 1

## 2018-04-13 MED ORDER — CEFAZOLIN SODIUM-DEXTROSE 2-4 GM/100ML-% IV SOLN
2.0000 g | INTRAVENOUS | Status: AC
  Administered 2018-04-13: 2 g via INTRAVENOUS
  Filled 2018-04-13: qty 100

## 2018-04-13 MED ORDER — LIDOCAINE HCL (CARDIAC) PF 100 MG/5ML IV SOSY
PREFILLED_SYRINGE | INTRAVENOUS | Status: DC | PRN
  Administered 2018-04-13: 60 mg via INTRAVENOUS

## 2018-04-13 MED ORDER — POLYETHYLENE GLYCOL 3350 17 G PO PACK
17.0000 g | PACK | Freq: Two times a day (BID) | ORAL | Status: DC
  Administered 2018-04-13 – 2018-04-14 (×2): 17 g via ORAL
  Filled 2018-04-13 (×2): qty 1

## 2018-04-13 MED ORDER — ONDANSETRON HCL 4 MG/2ML IJ SOLN: 4.0000 mg | Freq: Four times a day (QID) | INTRAMUSCULAR | Status: DC | PRN

## 2018-04-13 MED ORDER — METOCLOPRAMIDE HCL 5 MG PO TABS: 5.0000 mg | ORAL_TABLET | Freq: Three times a day (TID) | ORAL | Status: DC | PRN

## 2018-04-13 MED ORDER — ONDANSETRON HCL 4 MG PO TABS: 4.0000 mg | ORAL_TABLET | Freq: Four times a day (QID) | ORAL | Status: DC | PRN

## 2018-04-13 MED ORDER — ONDANSETRON HCL 4 MG/2ML IJ SOLN
INTRAMUSCULAR | Status: DC | PRN
  Administered 2018-04-13: 4 mg via INTRAVENOUS

## 2018-04-13 MED ORDER — METHOCARBAMOL 500 MG PO TABS
500.0000 mg | ORAL_TABLET | Freq: Four times a day (QID) | ORAL | Status: DC | PRN
  Administered 2018-04-13 – 2018-04-14 (×2): 500 mg via ORAL
  Filled 2018-04-13 (×2): qty 1

## 2018-04-13 MED ORDER — PHENOL 1.4 % MT LIQD: 1.0000 | OROMUCOSAL | Status: DC | PRN

## 2018-04-13 MED ORDER — METHOCARBAMOL 500 MG IVPB - SIMPLE MED
500.0000 mg | Freq: Four times a day (QID) | INTRAVENOUS | Status: DC | PRN
  Administered 2018-04-13: 500 mg via INTRAVENOUS
  Filled 2018-04-13: qty 50

## 2018-04-13 MED ORDER — ALUM & MAG HYDROXIDE-SIMETH 200-200-20 MG/5ML PO SUSP: 15.0000 mL | ORAL | Status: DC | PRN

## 2018-04-13 MED ORDER — TRANEXAMIC ACID-NACL 1000-0.7 MG/100ML-% IV SOLN
1000.0000 mg | INTRAVENOUS | Status: AC
  Administered 2018-04-13: 1000 mg via INTRAVENOUS
  Filled 2018-04-13: qty 100

## 2018-04-13 MED ORDER — FENTANYL CITRATE (PF) 100 MCG/2ML IJ SOLN
INTRAMUSCULAR | Status: AC
  Filled 2018-04-13: qty 2

## 2018-04-13 MED ORDER — PROPOFOL 10 MG/ML IV BOLUS
INTRAVENOUS | Status: AC
  Filled 2018-04-13: qty 40

## 2018-04-13 MED ORDER — PHENYLEPHRINE 40 MCG/ML (10ML) SYRINGE FOR IV PUSH (FOR BLOOD PRESSURE SUPPORT)
PREFILLED_SYRINGE | INTRAVENOUS | Status: DC | PRN
  Administered 2018-04-13 (×6): 80 ug via INTRAVENOUS

## 2018-04-13 MED ORDER — TRANEXAMIC ACID-NACL 1000-0.7 MG/100ML-% IV SOLN
1000.0000 mg | Freq: Once | INTRAVENOUS | Status: AC
  Administered 2018-04-13: 1000 mg via INTRAVENOUS
  Filled 2018-04-13: qty 100

## 2018-04-13 MED ORDER — DOCUSATE SODIUM 100 MG PO CAPS
100.0000 mg | ORAL_CAPSULE | Freq: Two times a day (BID) | ORAL | Status: DC
  Administered 2018-04-13 – 2018-04-14 (×3): 100 mg via ORAL
  Filled 2018-04-13 (×3): qty 1

## 2018-04-13 MED ORDER — FENTANYL CITRATE (PF) 100 MCG/2ML IJ SOLN: 25.0000 ug | INTRAMUSCULAR | Status: DC | PRN

## 2018-04-13 MED ORDER — LACTATED RINGERS IV SOLN
INTRAVENOUS | Status: DC
  Administered 2018-04-13 (×2): via INTRAVENOUS

## 2018-04-13 MED ORDER — METOCLOPRAMIDE HCL 5 MG/ML IJ SOLN: 5.0000 mg | Freq: Three times a day (TID) | INTRAMUSCULAR | Status: DC | PRN

## 2018-04-13 MED ORDER — MAGNESIUM CITRATE PO SOLN: 1.0000 | Freq: Once | ORAL | Status: DC | PRN

## 2018-04-13 MED ORDER — BISACODYL 10 MG RE SUPP: 10.0000 mg | Freq: Every day | RECTAL | Status: DC | PRN

## 2018-04-13 MED ORDER — FERROUS SULFATE 325 (65 FE) MG PO TABS: 325.0000 mg | ORAL_TABLET | Freq: Three times a day (TID) | ORAL | 3 refills | Status: AC

## 2018-04-13 MED ORDER — SODIUM CHLORIDE 0.9 % IV SOLN
INTRAVENOUS | Status: DC
  Administered 2018-04-13: 17:00:00 via INTRAVENOUS

## 2018-04-13 MED ORDER — STERILE WATER FOR IRRIGATION IR SOLN
Status: DC | PRN
  Administered 2018-04-13: 2000 mL

## 2018-04-13 MED ORDER — ANASTROZOLE 1 MG PO TABS
1.0000 mg | ORAL_TABLET | Freq: Every day | ORAL | Status: DC
  Administered 2018-04-13 – 2018-04-14 (×2): 1 mg via ORAL
  Filled 2018-04-13 (×2): qty 1

## 2018-04-13 MED ORDER — DIPHENHYDRAMINE HCL 12.5 MG/5ML PO ELIX: 12.5000 mg | ORAL_SOLUTION | ORAL | Status: DC | PRN

## 2018-04-13 MED ORDER — ACETAMINOPHEN 325 MG PO TABS
325.0000 mg | ORAL_TABLET | Freq: Four times a day (QID) | ORAL | Status: DC | PRN
  Administered 2018-04-13 – 2018-04-14 (×2): 650 mg via ORAL
  Filled 2018-04-13 (×3): qty 2

## 2018-04-13 MED ORDER — HYDROCODONE-ACETAMINOPHEN 5-325 MG PO TABS: 1.0000 | ORAL_TABLET | ORAL | Status: DC | PRN

## 2018-04-13 MED ORDER — FENTANYL CITRATE (PF) 100 MCG/2ML IJ SOLN
INTRAMUSCULAR | Status: DC | PRN
  Administered 2018-04-13: 100 ug via INTRAVENOUS

## 2018-04-13 MED ORDER — DEXAMETHASONE SODIUM PHOSPHATE 10 MG/ML IJ SOLN
10.0000 mg | Freq: Once | INTRAMUSCULAR | Status: AC
  Administered 2018-04-14: 10 mg via INTRAVENOUS
  Filled 2018-04-13: qty 1

## 2018-04-13 MED ORDER — PHENYLEPHRINE 40 MCG/ML (10ML) SYRINGE FOR IV PUSH (FOR BLOOD PRESSURE SUPPORT)
PREFILLED_SYRINGE | INTRAVENOUS | Status: AC
  Filled 2018-04-13: qty 10

## 2018-04-13 MED ORDER — ASPIRIN 81 MG PO CHEW
81.0000 mg | CHEWABLE_TABLET | Freq: Two times a day (BID) | ORAL | Status: DC
  Administered 2018-04-13 – 2018-04-14 (×2): 81 mg via ORAL
  Filled 2018-04-13 (×2): qty 1

## 2018-04-13 MED ORDER — HYDROCODONE-ACETAMINOPHEN 7.5-325 MG PO TABS: 1.0000 | ORAL_TABLET | ORAL | Status: DC | PRN

## 2018-04-13 MED ORDER — HYDROMORPHONE HCL 1 MG/ML IJ SOLN
0.5000 mg | INTRAMUSCULAR | Status: DC | PRN
  Administered 2018-04-13 – 2018-04-14 (×2): 0.5 mg via INTRAVENOUS
  Filled 2018-04-13: qty 1

## 2018-04-13 MED ORDER — MENTHOL 3 MG MT LOZG: 1.0000 | LOZENGE | OROMUCOSAL | Status: DC | PRN

## 2018-04-13 MED ORDER — METHOCARBAMOL 500 MG IVPB - SIMPLE MED
INTRAVENOUS | Status: AC
  Filled 2018-04-13: qty 50

## 2018-04-13 MED ORDER — HYDROMORPHONE HCL 1 MG/ML IJ SOLN
INTRAMUSCULAR | Status: AC
  Filled 2018-04-13: qty 1

## 2018-04-13 SURGICAL SUPPLY — 46 items
BAG DECANTER FOR FLEXI CONT (MISCELLANEOUS) IMPLANT
BAG ZIPLOCK 12X15 (MISCELLANEOUS) IMPLANT
BLADE SAG 18X100X1.27 (BLADE) ×3 IMPLANT
BLADE SURG SZ10 CARB STEEL (BLADE) ×6 IMPLANT
COVER PERINEAL POST (MISCELLANEOUS) ×3 IMPLANT
COVER SURGICAL LIGHT HANDLE (MISCELLANEOUS) ×3 IMPLANT
COVER WAND RF STERILE (DRAPES) ×3 IMPLANT
CUP ACETBLR 52 OD PINNACLE (Hips) ×3 IMPLANT
DERMABOND ADVANCED (GAUZE/BANDAGES/DRESSINGS) ×2
DERMABOND ADVANCED .7 DNX12 (GAUZE/BANDAGES/DRESSINGS) ×1 IMPLANT
DRAPE STERI IOBAN 125X83 (DRAPES) ×3 IMPLANT
DRAPE U-SHAPE 47X51 STRL (DRAPES) ×6 IMPLANT
DRESSING AQUACEL AG SP 3.5X10 (GAUZE/BANDAGES/DRESSINGS) ×1 IMPLANT
DRSG AQUACEL AG SP 3.5X10 (GAUZE/BANDAGES/DRESSINGS) ×3
DURAPREP 26ML APPLICATOR (WOUND CARE) ×3 IMPLANT
ELECT REM PT RETURN 15FT ADLT (MISCELLANEOUS) ×3 IMPLANT
ELIMINATOR HOLE APEX DEPUY (Hips) ×3 IMPLANT
GLOVE BIOGEL M STRL SZ7.5 (GLOVE) IMPLANT
GLOVE BIOGEL PI IND STRL 7.0 (GLOVE) ×2 IMPLANT
GLOVE BIOGEL PI IND STRL 7.5 (GLOVE) ×3 IMPLANT
GLOVE BIOGEL PI IND STRL 8.5 (GLOVE) ×1 IMPLANT
GLOVE BIOGEL PI INDICATOR 7.0 (GLOVE) ×4
GLOVE BIOGEL PI INDICATOR 7.5 (GLOVE) ×6
GLOVE BIOGEL PI INDICATOR 8.5 (GLOVE) ×2
GLOVE ECLIPSE 8.0 STRL XLNG CF (GLOVE) ×6 IMPLANT
GLOVE ORTHO TXT STRL SZ7.5 (GLOVE) ×3 IMPLANT
GLOVE SURG SS PI 6.5 STRL IVOR (GLOVE) ×3 IMPLANT
GOWN SPEC L4 XLG W/TWL (GOWN DISPOSABLE) ×3 IMPLANT
GOWN STRL REUS W/TWL 2XL LVL3 (GOWN DISPOSABLE) ×3 IMPLANT
GOWN STRL REUS W/TWL LRG LVL3 (GOWN DISPOSABLE) ×6 IMPLANT
HEAD CERAMIC DELTA 36 PLUS 1.5 (Hips) ×3 IMPLANT
HOLDER FOLEY CATH W/STRAP (MISCELLANEOUS) ×3 IMPLANT
LINER NEUTRAL 52X36MM PLUS 4 (Liner) ×3 IMPLANT
PACK ANTERIOR HIP CUSTOM (KITS) ×3 IMPLANT
SCREW 6.5MMX25MM (Screw) ×3 IMPLANT
STEM FEMORAL SZ6 HIGH ACTIS (Stem) ×3 IMPLANT
SUT MNCRL AB 4-0 PS2 18 (SUTURE) ×3 IMPLANT
SUT STRATAFIX 0 PDS 27 VIOLET (SUTURE) ×3
SUT VIC AB 1 CT1 36 (SUTURE) ×9 IMPLANT
SUT VIC AB 2-0 CT1 27 (SUTURE) ×4
SUT VIC AB 2-0 CT1 TAPERPNT 27 (SUTURE) ×2 IMPLANT
SUTURE STRATFX 0 PDS 27 VIOLET (SUTURE) ×1 IMPLANT
TRAY FOLEY CATH 14FRSI W/METER (CATHETERS) ×3 IMPLANT
TRAY FOLEY MTR SLVR 16FR STAT (SET/KITS/TRAYS/PACK) IMPLANT
WATER STERILE IRR 1000ML POUR (IV SOLUTION) ×3 IMPLANT
YANKAUER SUCT BULB TIP 10FT TU (MISCELLANEOUS) ×3 IMPLANT

## 2018-04-13 NOTE — Interval H&P Note (Signed)
History and Physical Interval Note:  04/13/2018 10:10 AM  Angelica West  has presented today for surgery, with the diagnosis of right hip osteoarthritis  The various methods of treatment have been discussed with the patient and family. After consideration of risks, benefits and other options for treatment, the patient has consented to  Procedure(s): TOTAL HIP ARTHROPLASTY ANTERIOR APPROACH (Right) as a surgical intervention .  The patient's history has been reviewed, patient examined, no change in status, stable for surgery.  I have reviewed the patient's chart and labs.  Questions were answered to the patient's satisfaction.     Mauri Pole

## 2018-04-13 NOTE — Anesthesia Procedure Notes (Signed)
Spinal  End time: 04/13/2018 11:23 AM Staffing Resident/CRNA: Caryl Pina T, CRNA Performed: resident/CRNA  Preanesthetic Checklist Completed: patient identified, site marked, surgical consent, pre-op evaluation, timeout performed, IV checked, risks and benefits discussed and monitors and equipment checked Spinal Block Patient position: sitting Prep: DuraPrep Patient monitoring: blood pressure, continuous pulse ox and heart rate Approach: midline Location: L3-4 Injection technique: single-shot Needle Needle type: Pencan  Needle gauge: 24 G Needle length: 9 cm Assessment Sensory level: T6 Additional Notes Expiration date of kit checked and confirmed. Patient tolerated procedure well, without complications.

## 2018-04-13 NOTE — Op Note (Signed)
NAME:  Angelica West.: [id number]      MEDICAL RECORD NO.: [id number]      FACILITY:  [facility]      PHYSICIAN:  Mauri Pole  DATE OF BIRTH:  17-Jun-1950     DATE OF PROCEDURE:  04/13/2018                                 OPERATIVE REPORT         PREOPERATIVE DIAGNOSIS: Right  hip osteoarthritis.      POSTOPERATIVE DIAGNOSIS:  Right hip osteoarthritis.      PROCEDURE:  Right total hip replacement through an anterior approach   utilizing DePuy THR system, component size 14mm pinnacle cup, a size 36+4 neutral   Altrex liner, a size 6 hi Actis femoral stem with a 36+1.5 delta ceramic   ball.      SURGEON:  Pietro Cassis. Alvan Dame, M.D.      ASSISTANT:  Danae Orleans, PA-C     ANESTHESIA:  Spinal.      SPECIMENS:  None.      COMPLICATIONS:  None.      BLOOD LOSS:  250 cc     DRAINS:  None.      INDICATION OF THE PROCEDURE:  Angelica West is a 68 y.o. female who had   presented to office for evaluation of right hip pain.  Radiographs revealed   progressive degenerative changes with bone-on-bone   articulation of the  hip joint, including subchondral cystic changes and osteophytes.  The patient had painful limited range of   motion significantly affecting their overall quality of life and function.  The patient was failing to    respond to conservative measures including medications and/or injections and activity modification and at this point was ready   to proceed with more definitive measures.  Consent was obtained for   benefit of pain relief.  Specific risks of infection, DVT, component   failure, dislocation, neurovascular injury, and need for revision surgery were reviewed in the office as well discussion of   the anterior versus posterior approach were reviewed.     PROCEDURE IN DETAIL:  The patient was brought to operative theater.   Once adequate anesthesia, preoperative antibiotics, 2 gm of Ancef, 1 gm of Tranexamic  Acid, and 10 mg of Decadron were administered, the patient was positioned supine on the [employer] table.  Once the patient was safely positioned with adequate padding of boney prominences we predraped out the hip, and used fluoroscopy to confirm orientation of the pelvis.      The right hip was then prepped and draped from proximal iliac crest to   mid thigh with a shower curtain technique.      Time-out was performed identifying the patient, planned procedure, and the appropriate extremity.     An incision was then made 2 cm lateral to the   anterior superior iliac spine extending over the orientation of the   tensor fascia lata muscle and sharp dissection was carried down to the   fascia of the muscle.      The fascia was then incised.  The muscle belly was identified and swept   laterally and retractor placed along the superior neck.  Following   cauterization of the circumflex vessels and removing some  pericapsular   fat, a second cobra retractor was placed on the inferior neck.  A T-capsulotomy was made along the line of the   superior neck to the trochanteric fossa, then extended proximally and   distally.  Tag sutures were placed and the retractors were then placed   intracapsular.  We then identified the trochanteric fossa and   orientation of my neck cut and then made a neck osteotomy with the femur on traction.  The femoral   head was removed without difficulty or complication.  Traction was let   off and retractors were placed posterior and anterior around the   acetabulum.      The labrum and foveal tissue were debrided.  I began reaming with a 46 mm   reamer and reamed up to 51 mm reamer with good bony bed preparation and a 52 mm  cup was chosen.  The final 52 mm Pinnacle cup was then impacted under fluoroscopy to confirm the depth of penetration and orientation with respect to   Abduction and forward flexion.  A screw was placed into the ilium followed by the hole eliminator.   The final   36+4 neutral Altrex liner was impacted with good visualized rim fit.  The cup was positioned anatomically within the acetabular portion of the pelvis.      At this point, the femur was rolled to 100 degrees.  Further capsule was   released off the inferior aspect of the femoral neck.  I then   released the superior capsule proximally.  With the leg in a neutral position the hook was placed laterally   along the femur under the vastus lateralis origin and elevated manually and then held in position using the hook attachment on the bed.  The leg was then extended and adducted with the leg rolled to 100   degrees of external rotation.  Retractors were placed along the medial calcar and posteriorly over the greater trochanter.  Once the proximal femur was fully   exposed, I used a box osteotome to set orientation.  I then began   broaching with the starting chili pepper broach and passed this by hand and then broached up to 6.  With the 6 broach in place I chose a high offset neck and did several trial reductions.  The offset was appropriate, leg lengths   appeared to be equal best matched with the +1.5 head ball trial confirmed radiographically.   Given these findings, I went ahead and dislocated the hip, repositioned all   retractors and positioned the right hip in the extended and abducted position.  The final 6 hi Actis femoral stem was   chosen and it was impacted down to the level of neck cut.  Based on this   and the trial reductions, a final 36+1.5 delta ceramic ball was chosen and   impacted onto a clean and dry trunnion, and the hip was reduced.  The   hip had been irrigated throughout the case again at this point.  I did   reapproximate the superior capsular leaflet to the anterior leaflet   using #1 Vicryl.  The fascia of the   tensor fascia lata muscle was then reapproximated using #1 Vicryl and #0 Stratafix sutures.  The   remaining wound was closed with 2-0 Vicryl and  running 4-0 Monocryl.   The hip was cleaned, dried, and dressed sterilely using Dermabond and   Aquacel dressing.  The patient was then brought  to recovery room in stable condition tolerating the procedure well.    Danae Orleans, PA-C was present for the entirety of the case involved from   preoperative positioning, perioperative retractor management, general   facilitation of the case, as well as primary wound closure as assistant.            Pietro Cassis Alvan Dame, M.D.        04/13/2018 12:41 PM

## 2018-04-13 NOTE — Anesthesia Preprocedure Evaluation (Addendum)
Anesthesia Evaluation  Patient identified by MRN, date of birth, ID band Patient awake    Reviewed: Allergy & Precautions, NPO status , Patient's Chart, lab work & pertinent test results  Airway Mallampati: II  TM Distance: >3 FB     Dental   Pulmonary neg pulmonary ROS,    breath sounds clear to auscultation       Cardiovascular hypertension,  Rhythm:Regular Rate:Normal     Neuro/Psych    GI/Hepatic negative GI ROS, Neg liver ROS,   Endo/Other  negative endocrine ROS  Renal/GU negative Renal ROS     Musculoskeletal   Abdominal   Peds  Hematology   Anesthesia Other Findings   Reproductive/Obstetrics                           Anesthesia Physical Anesthesia Plan  ASA: III  Anesthesia Plan: Spinal   Post-op Pain Management:    Induction: Intravenous  PONV Risk Score and Plan: 2 and Ondansetron, Dexamethasone and Midazolam  Airway Management Planned: Simple Face Mask and Nasal Cannula  Additional Equipment:   Intra-op Plan:   Post-operative Plan:   Informed Consent: I have reviewed the patients History and Physical, chart, labs and discussed the procedure including the risks, benefits and alternatives for the proposed anesthesia with the patient or authorized representative who has indicated his/her understanding and acceptance.     Dental advisory given  Plan Discussed with: Anesthesiologist and CRNA  Anesthesia Plan Comments:         Anesthesia Quick Evaluation

## 2018-04-13 NOTE — Anesthesia Postprocedure Evaluation (Signed)
Anesthesia Post Note  Patient: Angelica West  Procedure(s) Performed: TOTAL HIP ARTHROPLASTY ANTERIOR APPROACH (Right Hip)     Patient location during evaluation: PACU Anesthesia Type: Spinal Level of consciousness: awake Pain management: pain level controlled Vital Signs Assessment: post-procedure vital signs reviewed and stable Respiratory status: spontaneous breathing Cardiovascular status: stable Postop Assessment: spinal receding Anesthetic complications: no    Last Vitals:  Vitals:   04/13/18 1500 04/13/18 1614  BP: 136/80 135/80  Pulse: 62 65  Resp: 14 16  Temp:  36.7 C  SpO2: 100% 100%    Last Pain:  Vitals:   04/13/18 1614  TempSrc: Oral  PainSc:                  Gid Schoffstall

## 2018-04-13 NOTE — Discharge Instructions (Signed)

## 2018-04-13 NOTE — Evaluation (Signed)
Physical Therapy Evaluation Patient Details Name: Angelica West MRN: [id number] DOB: [birth year] Today's Date: 04/13/2018   History of Present Illness  68 yo female s/p R DA-THA on 04/13/18. PMH includes breast cancer with radiation and mastectomy, HTN, anxiety.   Clinical Impression  Pt presents with mild R hip pain, post-operative weakness, difficulty performing bed mobility and decreased tolerance for ambulation due to fatigue.  Pt to benefit from acute PT to address deficits. Pt ambulated 15 ft with RW with min guard assist. Pt educated on ankle pumps (20/hour) to perform this afternoon/evening to increase circulation, to pt's tolerance and limited by pain. PT to progress mobility as tolerated, and will continue to follow acutely.        Follow Up Recommendations Follow surgeon's recommendation for DC plan and follow-up therapies;Supervision for mobility/OOB;Home health PT(HEP; pt states she wants HHPT if possible )    Equipment Recommendations  None recommended by PT    Recommendations for Other Services       Precautions / Restrictions Precautions Precautions: Fall Restrictions Weight Bearing Restrictions: No Other Position/Activity Restrictions: WBAT       Mobility  Bed Mobility Overal bed mobility: Needs Assistance Bed Mobility: Supine to Sit     Supine to sit: Min assist;HOB elevated     General bed mobility comments: Min assist for RLE lifting and translation to EOB. Pt with increased time to scoot to EOB.   Transfers Overall transfer level: Needs assistance Equipment used: Rolling walker (2 wheeled) Transfers: Sit to/from Stand Sit to Stand: From elevated surface;Min guard         General transfer comment: min guard for safety, very increased time to rise and increased time to perform hip extension in WB. VC for hand placement.   Ambulation/Gait Ambulation/Gait assistance: Min guard;+2 safety/equipment Gait Distance (Feet): 15 Feet Assistive device:  Rolling walker (2 wheeled) Gait Pattern/deviations: Step-to pattern;Decreased stride length;Decreased weight shift to right;Antalgic Gait velocity: decr    General Gait Details: Min guard for safety. Verbal cuing for placeemnt in RW, turning, sequencing. Pt required sitting in chair follow after 15 ft due to fatigue. Pt with periods of closing her eyes during walking, and PT would state "open your eyes" and pt states "yall aren't going to let me fall"   Stairs            Wheelchair Mobility    Modified Rankin (Stroke Patients Only)       Balance Overall balance assessment: Mild deficits observed, not formally tested                                           Pertinent Vitals/Pain Pain Assessment: 0-10 Pain Score: 2  Pain Location: R hip  Pain Descriptors / Indicators: Sore;Aching Pain Intervention(s): Limited activity within patient's tolerance;Repositioned;Ice applied;Monitored during session;Premedicated before session    Home Living Family/patient expects to be discharged to:: Private residence Living Arrangements: Spouse/significant other Available Help at Discharge: Family;Available PRN/intermittently Type of Home: House Home Access: Stairs to enter Entrance Stairs-Rails: None Entrance Stairs-Number of Steps: 3 Home Layout: Two level;Able to live on main level with bedroom/bathroom Home Equipment: Gilford Rile - 2 wheels;Bedside commode      Prior Function Level of Independence: Independent               Hand Dominance   Dominant Hand: Right    Extremity/Trunk Assessment  Upper Extremity Assessment Upper Extremity Assessment: Overall WFL for tasks assessed    Lower Extremity Assessment Lower Extremity Assessment: Overall WFL for tasks assessed;RLE deficits/detail RLE Deficits / Details: suspected post-surgical hip weakenss; able to perform ankle pumps, weak quad set, heel slides, SLR with lift assist  RLE Sensation: WNL    Cervical  / Trunk Assessment Cervical / Trunk Assessment: Normal  Communication   Communication: No difficulties  Cognition Arousal/Alertness: Suspect due to medications;Awake/alert(drowsy ) Behavior During Therapy: WFL for tasks assessed/performed Overall Cognitive Status: Within Functional Limits for tasks assessed                                 General Comments: pt with increased alertness in sitting and standing, pt drowsy       General Comments      Exercises     Assessment/Plan    PT Assessment Patient needs continued PT services  PT Problem List Decreased strength;Pain;Decreased range of motion;Decreased activity tolerance;Decreased knowledge of use of DME;Decreased safety awareness;Decreased mobility;Decreased balance       PT Treatment Interventions DME instruction;Therapeutic activities;Gait training;Therapeutic exercise;Patient/family education;Stair training;Balance training;Functional mobility training    PT Goals (Current goals can be found in the Care Plan section)  Acute Rehab PT Goals Patient Stated Goal: none stated  PT Goal Formulation: With patient Time For Goal Achievement: 04/20/18 Potential to Achieve Goals: Good    Frequency 7X/week   Barriers to discharge        Co-evaluation               AM-PAC PT "6 Clicks" Mobility  Outcome Measure Help needed turning from your back to your side while in a flat bed without using bedrails?: A Little Help needed moving from lying on your back to sitting on the side of a flat bed without using bedrails?: A Little Help needed moving to and from a bed to a chair (including a wheelchair)?: A Little Help needed standing up from a chair using your arms (e.g., wheelchair or bedside chair)?: A Little Help needed to walk in hospital room?: A Little Help needed climbing 3-5 steps with a railing? : A Little 6 Click Score: 18    End of Session Equipment Utilized During Treatment: Gait belt Activity  Tolerance: Patient limited by fatigue;Patient limited by lethargy Patient left: in chair;with chair alarm set;with call bell/phone within reach;with family/visitor present;with SCD's reapplied Nurse Communication: Mobility status PT Visit Diagnosis: Other abnormalities of gait and mobility (R26.89);Difficulty in walking, not elsewhere classified (R26.2)    Time: 0177-9390 PT Time Calculation (min) (ACUTE ONLY): 23 min   Charges:   PT Evaluation $PT Eval Low Complexity: 1 Low PT Treatments $Gait Training: 8-22 mins       Julien Girt, PT Acute Rehabilitation Services Pager [phone]  Office [place] 04/13/2018, 7:35 PM

## 2018-04-13 NOTE — Transfer of Care (Signed)
Immediate Anesthesia Transfer of Care Note  Patient: Angelica West  Procedure(s) Performed: TOTAL HIP ARTHROPLASTY ANTERIOR APPROACH (Right Hip)  Patient Location: PACU  Anesthesia Type:MAC and Spinal  Level of Consciousness: awake, alert , oriented and patient cooperative  Airway & Oxygen Therapy: Patient Spontanous Breathing and Patient connected to face mask oxygen  Post-op Assessment: Report given to RN, Post -op Vital signs reviewed and stable and Patient moving all extremities  Post vital signs: Reviewed and stable  Last Vitals:  Vitals Value Taken Time  BP 110/74 04/13/2018  1:03 PM  Temp    Pulse 64 04/13/2018  1:04 PM  Resp 12 04/13/2018  1:04 PM  SpO2 98 % 04/13/2018  1:04 PM  Vitals shown include unvalidated device data.  Last Pain:  Vitals:   04/13/18 0910  TempSrc: Oral      Patients Stated Pain Goal: 4 (20/94/70 9628)  Complications: No apparent anesthesia complications

## 2018-04-14 ENCOUNTER — Encounter (HOSPITAL_COMMUNITY): Payer: Self-pay | Admitting: Orthopedic Surgery

## 2018-04-14 LAB — BASIC METABOLIC PANEL
ANION GAP: 7 (ref 5–15)
BUN: 14 mg/dL (ref 8–23)
CO2: 28 mmol/L (ref 22–32)
Calcium: 8.9 mg/dL (ref 8.9–10.3)
Chloride: 104 mmol/L (ref 98–111)
Creatinine, Ser: 0.91 mg/dL (ref 0.44–1.00)
Glucose, Bld: 121 mg/dL — ABNORMAL HIGH (ref 70–99)
Potassium: 4 mmol/L (ref 3.5–5.1)
Sodium: 139 mmol/L (ref 135–145)

## 2018-04-14 LAB — CBC
HCT: 33.9 % — ABNORMAL LOW (ref 36.0–46.0)
Hemoglobin: 10.8 g/dL — ABNORMAL LOW (ref 12.0–15.0)
MCH: 29.6 pg (ref 26.0–34.0)
MCHC: 31.9 g/dL (ref 30.0–36.0)
MCV: 92.9 fL (ref 80.0–100.0)
NRBC: 0 % (ref 0.0–0.2)
Platelets: 184 10*3/uL (ref 150–400)
RBC: 3.65 MIL/uL — AB (ref 3.87–5.11)
RDW: 13.5 % (ref 11.5–15.5)
WBC: 9 10*3/uL (ref 4.0–10.5)

## 2018-04-14 MED ORDER — METHOCARBAMOL 500 MG PO TABS: 500.0000 mg | ORAL_TABLET | Freq: Four times a day (QID) | ORAL | 0 refills | Status: DC | PRN

## 2018-04-14 MED ORDER — ASPIRIN 81 MG PO CHEW: 81.0000 mg | CHEWABLE_TABLET | Freq: Two times a day (BID) | ORAL | 0 refills | Status: AC

## 2018-04-14 MED ORDER — SODIUM CHLORIDE 0.9 % IV BOLUS
500.0000 mL | Freq: Once | INTRAVENOUS | Status: AC
  Administered 2018-04-14: 500 mL via INTRAVENOUS

## 2018-04-14 MED ORDER — HYDROCODONE-ACETAMINOPHEN 7.5-325 MG PO TABS: 1.0000 | ORAL_TABLET | ORAL | 0 refills | Status: DC | PRN

## 2018-04-14 NOTE — Plan of Care (Signed)
Patient discharged home in stable condition 

## 2018-04-14 NOTE — Progress Notes (Signed)
Physical Therapy Treatment Patient Details Name: Angelica West MRN: [id number] DOB: 24-Nov-1950 Today's Date: 04/14/2018    History of Present Illness 68 yo female s/p R DA-THA on 04/13/18. PMH includes breast cancer with radiation and mastectomy, HTN, anxiety.     PT Comments    POD # 1 pm session Spouse present during session.  Assisted with amb and practiced stairs.  Pt has met goals to D/C to home today.  Pt's cousin from [place] is staying with her to help.   Follow Up Recommendations  Follow surgeon's recommendation for DC plan and follow-up therapies;Supervision for mobility/OOB;Home health PT     Equipment Recommendations  3in1 (PT)    Recommendations for Other Services       Precautions / Restrictions Precautions Precautions: Fall Restrictions Weight Bearing Restrictions: No Other Position/Activity Restrictions: WBAT     Mobility  Bed Mobility               General bed mobility comments: Pt OOB in recliner   Transfers Overall transfer level: Needs assistance Equipment used: Rolling walker (2 wheeled) Transfers: Sit to/from Stand Sit to Stand: From elevated surface;Min guard         General transfer comment: 25% VC's on proper hand placement and safety with turns   Ambulation/Gait Ambulation/Gait assistance: Supervision;Min guard Gait Distance (Feet): 22 Feet Assistive device: Rolling walker (2 wheeled) Gait Pattern/deviations: Step-to pattern;Decreased stride length;Decreased weight shift to right;Antalgic Gait velocity: decreased    General Gait Details: tolerated an increased distance.  Slow.  VC's for proper upright posture and to keep "eyes open".     Stairs Stairs: Yes Stairs assistance: Min assist Stair Management: No rails;Step to pattern;Backwards Number of Stairs: 3 General stair comments: with spouse "hands on" assisted with 50% VC's on proper tech and safety   Wheelchair Mobility    Modified Rankin (Stroke Patients Only)       Balance                                            Cognition Arousal/Alertness: Awake/alert Behavior During Therapy: WFL for tasks assessed/performed Overall Cognitive Status: Within Functional Limits for tasks assessed                                        Exercises      General Comments        Pertinent Vitals/Pain Pain Assessment: 0-10 Pain Location: R hip  Pain Descriptors / Indicators: Sore;Aching Pain Intervention(s): Monitored during session;Repositioned    Home Living                      Prior Function            PT Goals (current goals can now be found in the care plan section) Progress towards PT goals: Progressing toward goals    Frequency    7X/week      PT Plan Current plan remains appropriate    Co-evaluation              AM-PAC PT "6 Clicks" Mobility   Outcome Measure  Help needed turning from your back to your side while in a flat bed without using bedrails?: A Little Help needed moving from lying on your back to sitting on  the side of a flat bed without using bedrails?: A Little Help needed moving to and from a bed to a chair (including a wheelchair)?: A Little Help needed standing up from a chair using your arms (e.g., wheelchair or bedside chair)?: A Little Help needed to walk in hospital room?: A Little Help needed climbing 3-5 steps with a railing? : A Little 6 Click Score: 18    End of Session Equipment Utilized During Treatment: Gait belt Activity Tolerance: Patient tolerated treatment well Patient left: in chair;with chair alarm set;with call bell/phone within reach;with family/visitor present;with SCD's reapplied Nurse Communication: Mobility status PT Visit Diagnosis: Other abnormalities of gait and mobility (R26.89);Difficulty in walking, not elsewhere classified (R26.2)     Time: 1430-1500 PT Time Calculation (min) (ACUTE ONLY): 30 min  Charges:  $Gait Training: 8-22  mins $Self Care/Home Management: [place]  PTA Acute  Rehabilitation Services Pager      [phone] Office      [phone]

## 2018-04-14 NOTE — Care Management Note (Signed)
Case Management Note  Patient Details  Name: GAYLEEN SHOLTZ MRN: [id number] Date of Birth: [birth year]  Subjective/Objective:  Discharge planning, spoke with patient at bedside. Have chosen [facility] for [facility] PT, evaluate and treat.               Action/Plan: Contacted [facility] for referral. They have accepted. Has DME. [phone]      Expected Discharge Date:                  Expected Discharge Plan:  [place]  In-House Referral:  NA  Discharge planning Services  CM Consult  Post Acute Care Choice:  Home Health Choice offered to:  Patient  DME Arranged:  N/A DME Agency:  NA  HH Arranged:  PT [place] Agency:  [facility] ([facility])  Status of Service:  Completed, signed off  If discussed at [employer], dates discussed:    Additional Comments:  Guadalupe Maple, RN 04/14/2018, 9:59 AM

## 2018-04-14 NOTE — Progress Notes (Signed)
Physical Therapy Treatment Patient Details Name: Angelica West MRN: [id number] DOB: [birth year] Today's Date: 04/14/2018    History of Present Illness 68 yo female s/p R DA-THA on 04/13/18. PMH includes breast cancer with radiation and mastectomy, HTN, anxiety.     PT Comments    POD # 1 am session Assisted OOB to amb while taking orthostatics vitals after completion of IV Bolus.  Tolerated amb an increased distance then Then returned to room to perform some TE's following HEP handout.  Instructed on proper tech, freq as well as use of ICE.     Follow Up Recommendations  Follow surgeon's recommendation for DC plan and follow-up therapies;Supervision for mobility/OOB;Home health PT     Equipment Recommendations  3in1 (PT)    Recommendations for Other Services       Precautions / Restrictions Precautions Precautions: Fall Restrictions Weight Bearing Restrictions: No Other Position/Activity Restrictions: WBAT     Mobility  Bed Mobility               General bed mobility comments: Pt OOB in recliner   Transfers Overall transfer level: Needs assistance Equipment used: Rolling walker (2 wheeled) Transfers: Sit to/from Stand Sit to Stand: From elevated surface;Min guard         General transfer comment: 25% VC's on proper hand placement and safety with turns   Ambulation/Gait Ambulation/Gait assistance: Supervision;Min guard Gait Distance (Feet): 18 Feet Assistive device: Rolling walker (2 wheeled) Gait Pattern/deviations: Step-to pattern;Decreased stride length;Decreased weight shift to right;Antalgic Gait velocity: decreased    General Gait Details: tolerated an increased distance.  Slow.  VC's for proper upright posture and to keep "eyes open".     Stairs             Wheelchair Mobility    Modified Rankin (Stroke Patients Only)       Balance                                            Cognition Arousal/Alertness:  Awake/alert Behavior During Therapy: WFL for tasks assessed/performed Overall Cognitive Status: Within Functional Limits for tasks assessed                                        Exercises   Total Hip Replacement TE's 10 reps ankle pumps 10 reps knee presses 10 reps heel slides 10 reps SAQ's 10 reps ABD Followed by ICE     General Comments        Pertinent Vitals/Pain Pain Assessment: 0-10 Pain Location: R hip  Pain Descriptors / Indicators: Sore;Aching Pain Intervention(s): Monitored during session;Repositioned    Home Living                      Prior Function            PT Goals (current goals can now be found in the care plan section) Progress towards PT goals: Progressing toward goals    Frequency    7X/week      PT Plan Current plan remains appropriate    Co-evaluation              AM-PAC PT "6 Clicks" Mobility   Outcome Measure  Help needed turning from your back to your side while in a  flat bed without using bedrails?: A Little Help needed moving from lying on your back to sitting on the side of a flat bed without using bedrails?: A Little Help needed moving to and from a bed to a chair (including a wheelchair)?: A Little Help needed standing up from a chair using your arms (e.g., wheelchair or bedside chair)?: A Little Help needed to walk in hospital room?: A Little Help needed climbing 3-5 steps with a railing? : A Little 6 Click Score: 18    End of Session Equipment Utilized During Treatment: Gait belt Activity Tolerance: Patient tolerated treatment well Patient left: in chair;with chair alarm set;with call bell/phone within reach;with family/visitor present;with SCD's reapplied Nurse Communication: Mobility status PT Visit Diagnosis: Other abnormalities of gait and mobility (R26.89);Difficulty in walking, not elsewhere classified (R26.2)     Time: 1000-1029 PT Time Calculation (min) (ACUTE ONLY): 29  min  Charges:  $Gait Training: 8-22 mins $Therapeutic Exercise: 8-22 mins                     Rica Koyanagi  PTA Acute  Rehabilitation Services Pager      [phone] Office      [phone]

## 2018-04-14 NOTE — Progress Notes (Signed)
     Subjective: 1 Day Post-Op Procedure(s) (LRB): TOTAL HIP ARTHROPLASTY ANTERIOR APPROACH (Right)   Patient reports pain as mild, pain controlled with medications.  No events throughout the night.  Hypostatic event this morning with her first time up.  Patient received a bolus and feels much better. Discussed the procedure and post-op expectations. Plan on being d/c'ed home if she does well with PT and no more events.    Objective:   VITALS:   Vitals:   04/14/18 0803 04/14/18 0805  BP: (!) 84/62 109/71  Pulse: 71 73  Resp:    Temp:    SpO2: 99% 100%    Dorsiflexion/Plantar flexion intact Incision: dressing C/D/I No cellulitis present Compartment soft  LABS Recent Labs    04/14/18 0549  HGB 10.8*  HCT 33.9*  WBC 9.0  PLT 184    Recent Labs    04/14/18 0549  NA 139  K 4.0  BUN 14  CREATININE 0.91  GLUCOSE 121*     Assessment/Plan: 1 Day Post-Op Procedure(s) (LRB): TOTAL HIP ARTHROPLASTY ANTERIOR APPROACH (Right) Foley cath d/c'ed Advance diet Up with therapy D/C IV fluids Discharge home Follow up in 2 weeks at [facility] ([place] Follow up with OLIN,Yuan Gann D in 2 weeks.  Contact information:  [facility]) [address] [place]. Angelica West   PAC  04/14/2018, 9:35 AM

## 2018-04-20 NOTE — Discharge Summary (Signed)
Physician Discharge Summary  Patient ID: Angelica West MRN: [id number] DOB/AGE: Aug 16, 1950 68 y.o.  Admit date: 04/13/2018 Discharge date: 04/14/2018   Procedures:  Procedure(s) (LRB): TOTAL HIP ARTHROPLASTY ANTERIOR APPROACH (Right)  Attending Physician:  Dr. Paralee West   Admission Diagnoses:   Right hip primary OA / pain  Discharge Diagnoses:  Principal Problem:   S/P right THA, AA  Past Medical History:  Diagnosis Date  . Anxiety   . History of radiation therapy 08/19/16- 09/16/16   Left Breast 40/05 Gy in 15 fractions, Left Breast Boost 10 Gy in 5 fractions  . Hypertension   . Personal history of radiation therapy   . Pre-diabetes     HPI:    Angelica West, 68 y.o. female, has a history of pain and functional disability in the right hip due to arthritis and patient has failed non-surgical conservative treatments for greater than 12 weeks to include NSAID's and/or analgesics and activity modification.  Onset of symptoms was gradual starting 2+ years ago with gradually worsening course since that time.The patient noted no past surgery on the right hip(s).  Patient currently rates pain in the right hip at 4 out of 10 with activity. Patient has night pain, worsening of pain with activity and weight bearing, trendelenberg gait, pain that interfers with activities of daily living and pain with passive range of motion. Patient has evidence of periarticular osteophytes and joint space narrowing by imaging studies. This condition presents safety issues increasing the risk of falls. There is no current active infection.  Risks, benefits and expectations were discussed with the patient.  Risks including but not limited to the risk of anesthesia, blood clots, nerve damage, blood vessel damage, failure of the prosthesis, infection and up to and including death.  Patient understand the risks, benefits and expectations and wishes to proceed with surgery.   PCP: Angelica Ruff, MD    Discharged Condition: good  Hospital Course:  Patient underwent the above stated procedure on 04/13/2018. Patient tolerated the procedure well and brought to the recovery room in good condition and subsequently to the floor.  POD #1 BP: 109/71 ; Pulse: 73 ;  Resp: 18 Patient reports pain as mild, pain controlled with medications.  No events throughout the night.  Hypostatic event this morning with her first time up.  Patient received a bolus and feels much better. Discussed the procedure and post-op expectations. Plan on being d/c'ed home if she does well with PT and no more events.  Dorsiflexion/plantar flexion intact, incision: dressing C/D/I, no cellulitis present and compartment soft.   LABS  Basename    HGB     10.8  HCT     33.9    Discharge Exam: General appearance: alert, cooperative and no distress Extremities: Homans sign is negative, no sign of DVT, no edema, redness or tenderness in the calves or thighs and no ulcers, gangrene or trophic changes  Disposition:  Home with follow up in 2 weeks   Follow-up Information    Angelica Cancel, MD. Schedule an appointment as soon as possible for a visit in 2 weeks.   Specialty:  Orthopedic Surgery Contact information: [address] [place] [address] [phone]        Home, [facility] Follow up.   Specialty:  [place]:  physical therapy Contact information: [address] [place] [address] [phone]           Discharge Instructions  Call MD / Call 911   Complete by:  As directed    If you experience chest pain or shortness of breath, CALL 911 and be transported to the hospital emergency room.  If you develope a fever above 101 F, pus (white drainage) or increased drainage or redness at the wound, or calf pain, call your surgeon's office.   Change dressing   Complete by:  As directed    Maintain surgical dressing until follow up in the clinic. If the edges start  to pull up, may reinforce with tape. If the dressing is no longer working, may remove and cover with gauze and tape, but must keep the area dry and clean.  Call with any questions or concerns.   Constipation Prevention   Complete by:  As directed    Drink plenty of fluids.  Prune juice may be helpful.  You may use a stool softener, such as Colace (over the counter) 100 mg twice a day.  Use MiraLax (over the counter) for constipation as needed.   Diet - low sodium heart healthy   Complete by:  As directed    Discharge instructions   Complete by:  As directed    Maintain surgical dressing until follow up in the clinic. If the edges start to pull up, may reinforce with tape. If the dressing is no longer working, may remove and cover with gauze and tape, but must keep the area dry and clean.  Follow up in 2 weeks at [facility]. Call with any questions or concerns.   Increase activity slowly as tolerated   Complete by:  As directed    Weight bearing as tolerated with assist device (walker, cane, etc) as directed, use it as long as suggested by your surgeon or therapist, typically at least 4-6 weeks.   TED hose   Complete by:  As directed    Use stockings (TED hose) for 2 weeks on both leg(s).  You may remove them at night for sleeping.      Allergies as of 04/14/2018   No Known Allergies     Medication List    STOP taking these medications   aspirin 81 MG tablet Replaced by:  aspirin 81 MG chewable tablet     TAKE these medications   anastrozole 1 MG tablet Commonly known as:  ARIMIDEX TAKE 1 TABLET BY MOUTH  DAILY   aspirin 81 MG chewable tablet Commonly known as:  ASPIRIN CHILDRENS Chew 1 tablet (81 mg total) by mouth 2 (two) times daily for 30 days. Take for 4 weeks, then resume regular dose. Replaces:  aspirin 81 MG tablet   CALCIUM 600 + D PO Take 1 tablet by mouth daily.   calcium carbonate 1250 (500 Ca) MG tablet Commonly known as:  OS-CAL - dosed in mg of  elemental calcium Take 1 tablet by mouth daily.   Coenzyme Q10 300 MG Caps Take 300 mg by mouth daily.   docusate sodium 100 MG capsule Commonly known as:  COLACE Take 1 capsule (100 mg total) by mouth 2 (two) times daily.   ferrous sulfate 325 (65 FE) MG tablet Commonly known as:  FERROUSUL Take 1 tablet (325 mg total) by mouth 3 (three) times daily with meals.   Fish Oil 1000 MG Caps Take 1 capsule by mouth every other day.   GARLIC PO Take 1,610 mg by mouth daily.   GLUCOSAMINE-MSM PO Take 2 tablets by mouth daily.   hydrochlorothiazide 25 MG tablet Commonly  known as:  HYDRODIURIL Take 25 mg by mouth daily.   HYDROcodone-acetaminophen 7.5-325 MG tablet Commonly known as:  NORCO Take 1-2 tablets by mouth every 4 (four) hours as needed for moderate pain.   lisinopril 20 MG tablet Commonly known as:  PRINIVIL,ZESTRIL Take 20 mg by mouth daily.   methocarbamol 500 MG tablet Commonly known as:  ROBAXIN Take 1 tablet (500 mg total) by mouth every 6 (six) hours as needed for muscle spasms.   multivitamin with minerals Tabs tablet Take 1-2 tablets by mouth daily.   polyethylene glycol packet Commonly known as:  MIRALAX / GLYCOLAX Take 17 g by mouth 2 (two) times daily.   TURMERIC PO Take 1 tablet by mouth daily.   vitamin C 1000 MG tablet Take 1,000 mg by mouth daily.   Vitamin D 50 MCG (2000 UT) tablet Take 4,000 Units by mouth daily.   zinc gluconate 50 MG tablet Take 50 mg by mouth daily.            Discharge Care Instructions  (From admission, onward)         Start     Ordered   04/14/18 0000  Change dressing    Comments:  Maintain surgical dressing until follow up in the clinic. If the edges start to pull up, may reinforce with tape. If the dressing is no longer working, may remove and cover with gauze and tape, but must keep the area dry and clean.  Call with any questions or concerns.   04/14/18 1241           Signed: West Pugh. Chane Cowden    PA-C  04/20/2018, 8:20 AM

## 2018-05-08 ENCOUNTER — Other Ambulatory Visit: Payer: Self-pay | Admitting: Oncology

## 2018-05-11 ENCOUNTER — Other Ambulatory Visit: Payer: Self-pay | Admitting: Oncology

## 2018-05-11 DIAGNOSIS — Z9889 Other specified postprocedural states: Secondary | ICD-10-CM

## 2018-05-25 ENCOUNTER — Other Ambulatory Visit: Payer: Self-pay | Admitting: *Deleted

## 2018-05-25 MED ORDER — ANASTROZOLE 1 MG PO TABS: 1.0000 mg | ORAL_TABLET | Freq: Every day | ORAL | 0 refills | Status: DC

## 2018-05-27 ENCOUNTER — Encounter (HOSPITAL_COMMUNITY): Payer: Self-pay | Admitting: Orthopedic Surgery

## 2018-05-30 IMAGING — MG NEEDLE LOCALIZATION OF THE LEFT BREAST WITH MAMMO GUIDANCE
8 of 17 series · 8 of 33 positions shown · non-contrast
Comparison: Previous exam(s).

ADDENDUM:
These results were called by telephone at the time of interpretation
on 07/14/2016 at [DATE] to Dr. MAZAREI DANAEI , who verbally
acknowledged these results.
CLINICAL DATA: Preoperative radioactive seed localization of left
breast upper outer quadrant DCIS.

EXAM:
MAMMOGRAPHIC GUIDED RADIOACTIVE SEED LOCALIZATION OF THE LEFT BREAST

[L XCCL (1 of 4)]
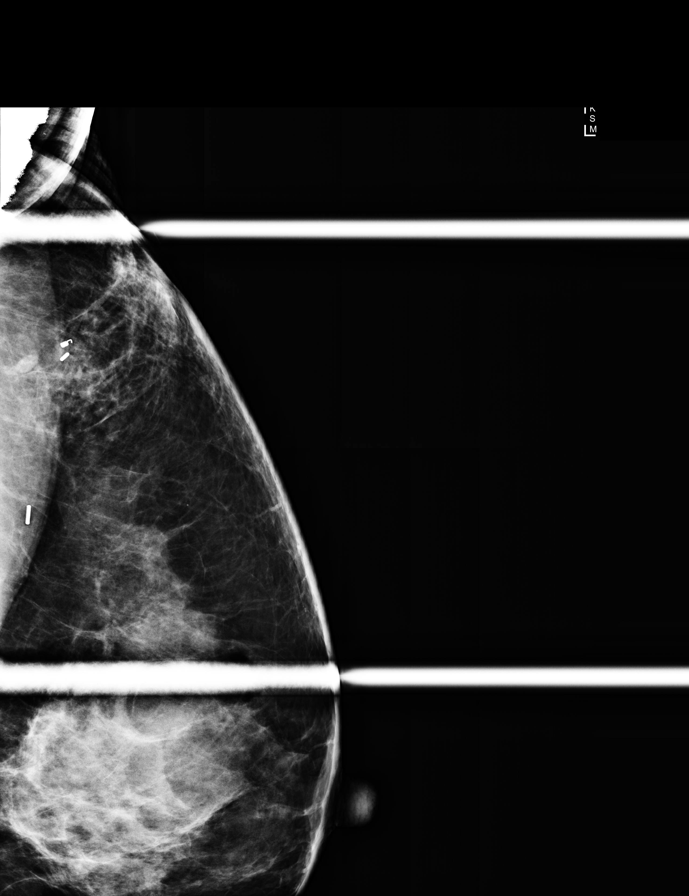

[L ML (1 of 3)]
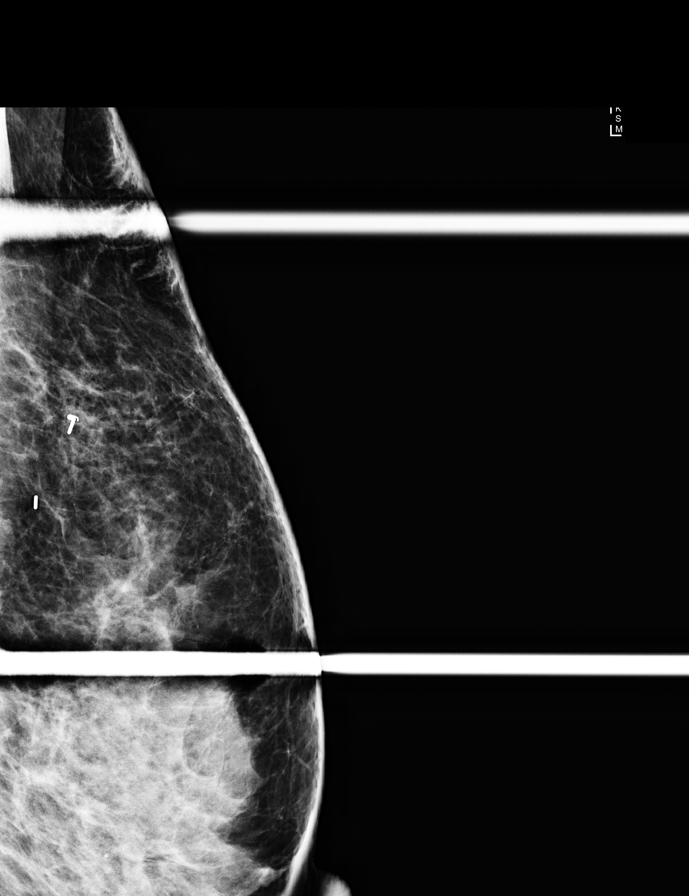

[L LM]
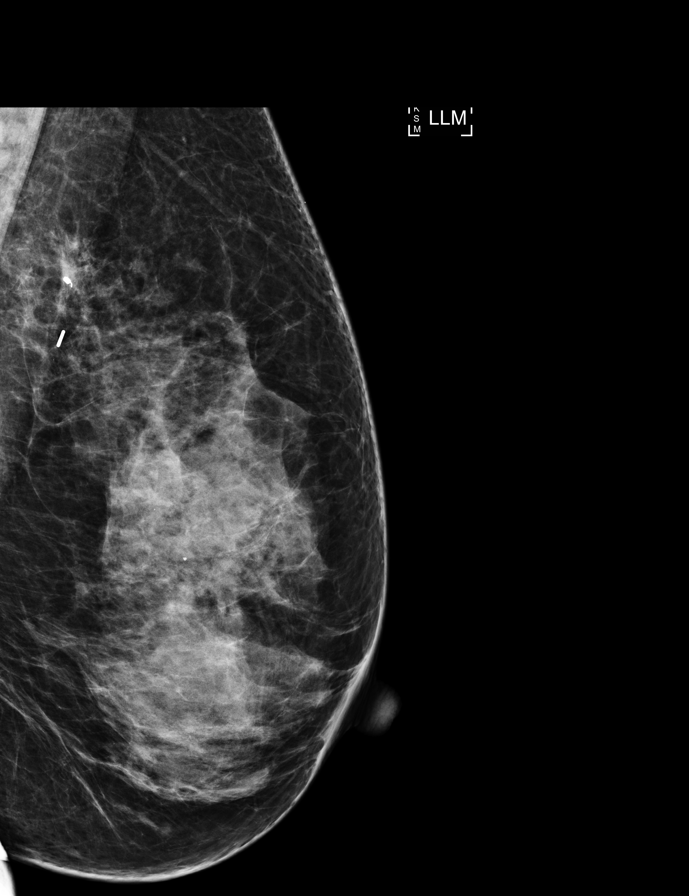

[L XCCL (2 of 4)]
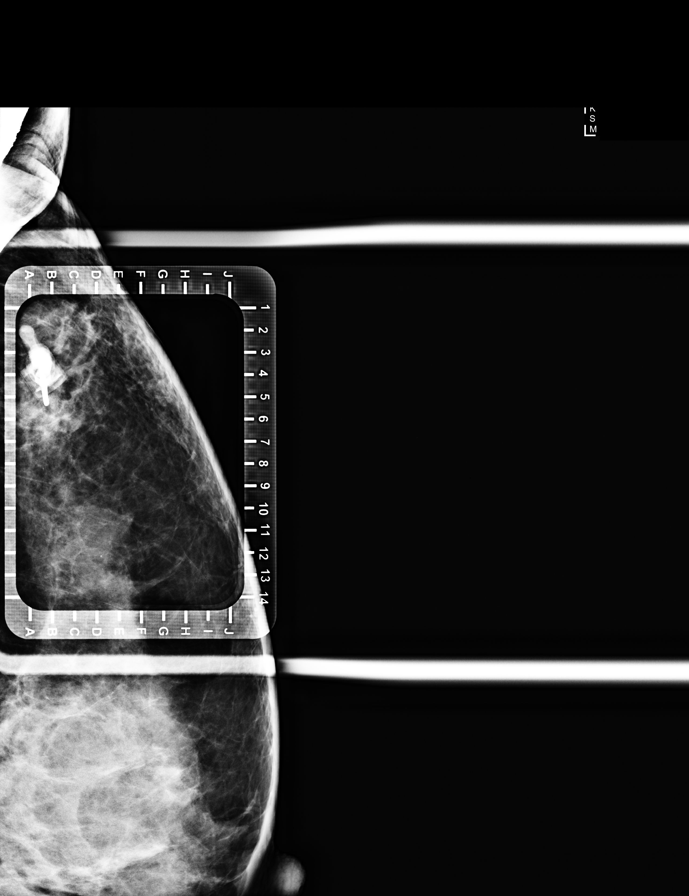

[L ML (2 of 3)]
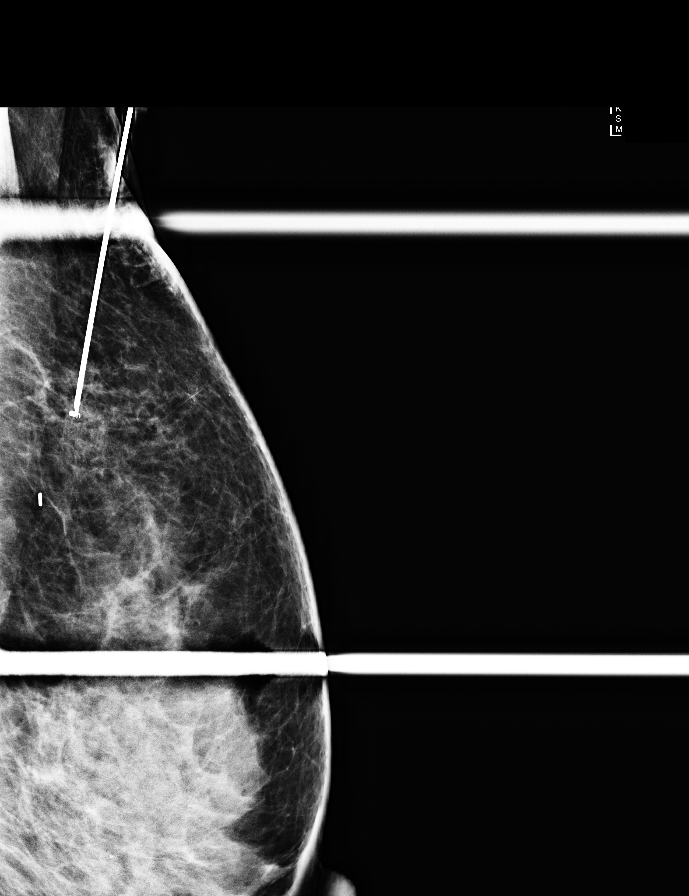

[L XCCL (3 of 4)]
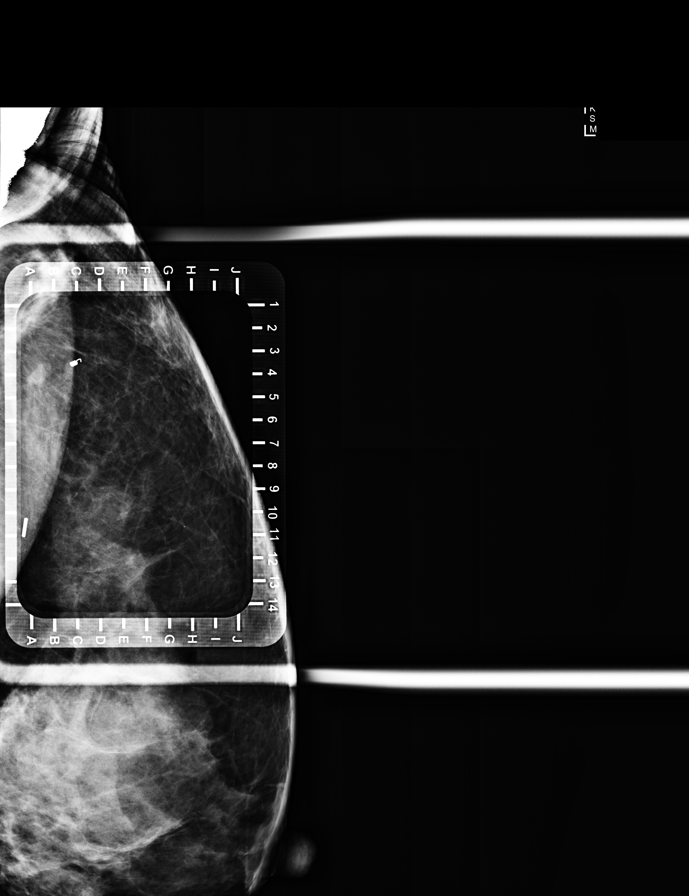

[L XCCL (4 of 4)]
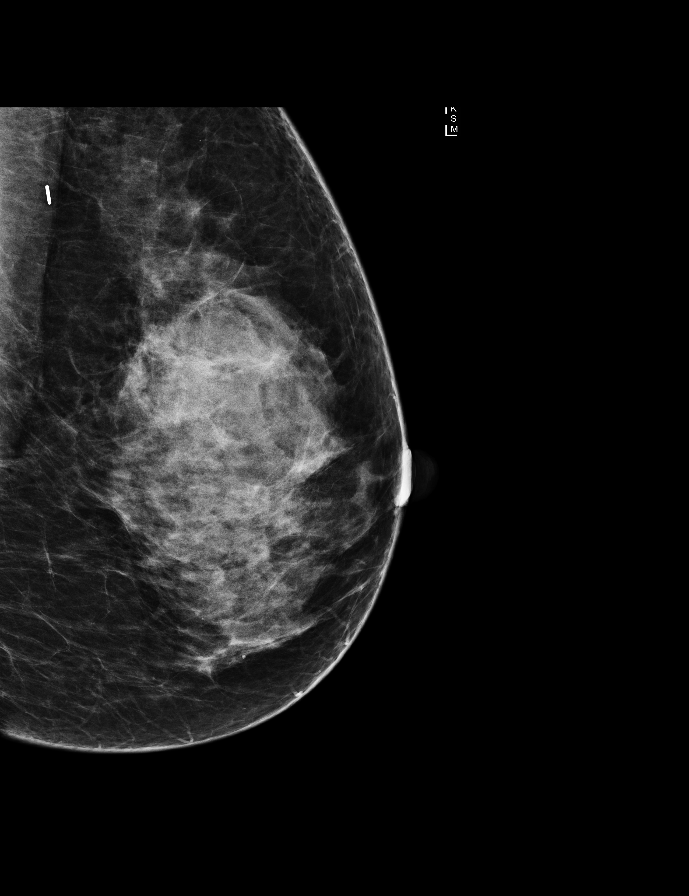

[L ML (3 of 3)]
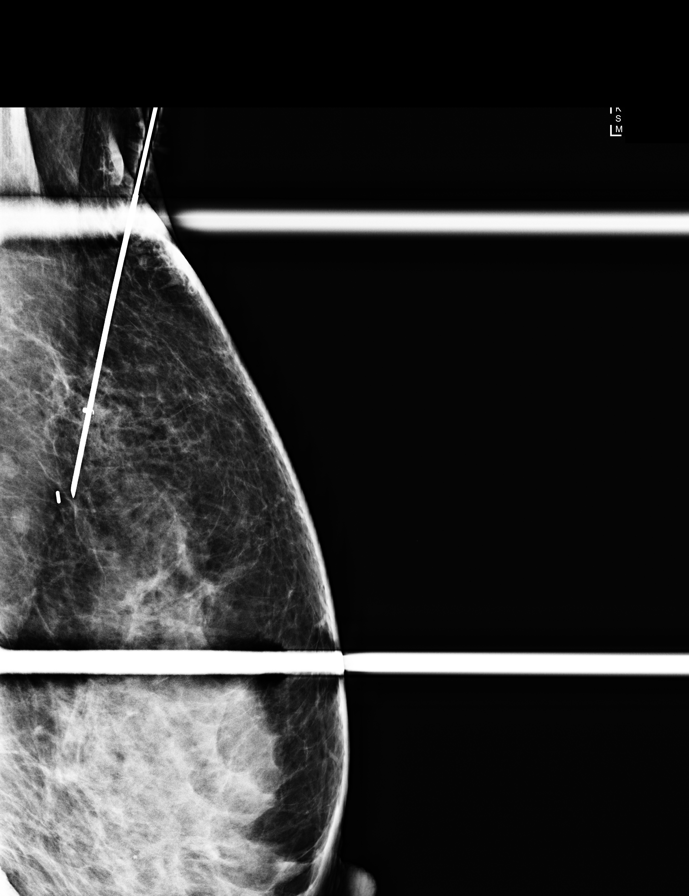

[8 of 33 positions shown; findings below may reference images not displayed]

FINDINGS: Patient presents for radioactive seed localization prior to left
breast lumpectomy. I met with the patient and we discussed the
procedure of seed localization including benefits and alternatives.
We discussed the high likelihood of a successful procedure. We
discussed the risks of the procedure including infection, bleeding,
seed displacement, tissue injury and further surgery. We discussed
the low dose of radioactivity involved in the procedure. Informed,
written consent was given.

The usual time-out protocol was performed immediately prior to the
procedure.

Using mammographic guidance, sterile technique, 1% lidocaine and an
P-I8O radioactive seed, the left breast upper outer quadrant, far
posterior depth, coil shaped post biopsy marker was localized using
a lateral approach. Initially, a 3D guided seed placement was
attempted, due to the far posterior location of the lesion, and
difficulty in visualizing it in the craniocaudal view. However, the
seed placed under 3D guidance ended up being located 4 cm medially
and 2.1 cm inferiorly from the post biopsy marker. Therefore, a
second radioactive seed was placed using 2D guidance and utilizing
exaggerated craniocaudal view and ML view during the procedure. The
second seed is located immediately adjacent to the post biopsy
tissue marker, within the area of DCIS. The follow-up mammogram
images confirm the seed in the expected location and were marked for
Dr. [place].

Follow-up survey of the patient confirms presence of the radioactive
seed.

First seed:

Order number of P-I8O seed:  [id number].

Total activity:  0.255 millicurie  Reference Date: [DATE] [DATE], [DATE]

[DATE] seed:

Order number of P-I8O seed:  [id number].

Total activity:  0.247 millicurie  Reference Date: July 03, 2016

The patient tolerated the procedure well and was released from the
[REDACTED]. She was given instructions regarding seed removal.
IMPRESSION: Radioactive seed localization of the left breast breast.

Two radioactive seeds have been placed within the left breast. The
radioactive seed within the far upper outer quadrant of the left
breast, posterior depth, is associated with the area of DCIS and the
coil shaped post biopsy marker.

## 2018-06-14 ENCOUNTER — Ambulatory Visit
Admission: RE | Admit: 2018-06-14 | Discharge: 2018-06-14 | Disposition: A | Discharge status: Home / Self Care | Payer: 59 | Source: Ambulatory Visit | Attending: Oncology | Admitting: Oncology

## 2018-06-14 ENCOUNTER — Other Ambulatory Visit: Payer: Self-pay

## 2018-06-14 DIAGNOSIS — Z9889 Other specified postprocedural states: Secondary | ICD-10-CM

## 2018-07-19 ENCOUNTER — Other Ambulatory Visit: Payer: Self-pay | Admitting: Oncology

## 2018-10-06 ENCOUNTER — Other Ambulatory Visit: Payer: Self-pay

## 2018-10-06 DIAGNOSIS — C50412 Malignant neoplasm of upper-outer quadrant of left female breast: Secondary | ICD-10-CM

## 2018-10-06 NOTE — Progress Notes (Signed)
[place]  [phone] [phone]     ID: Angelica West DOB: 11-07-1950  MR#: [id number]  QHU#:[id number]  Patient Care Team: Leighton Ruff, MD as PCP - General (Family Medicine) Erroll Luna, MD as Consulting Physician (General Surgery) Julio Zappia, Virgie Dad, MD as Consulting Physician (Oncology) Eppie Gibson, MD as Attending Physician (Radiation Oncology) Paralee Cancel, MD as Consulting Physician (Orthopedic Surgery) OTHER MD:  CHIEF COMPLAINT: Ductal carcinoma in situ  CURRENT TREATMENT: Anastrozole   INTERVAL HISTORY: Angelica West returns today for follow-up and treatment of her estrogen receptor positive breast cancer.    She continues on anastrozole, with good tolerance overall. She reports moderate vaginal dryness and mild hot flashes.  Her most recent Bone Density scan on 12/04/2016 showed a T-score of -0.9, which is considered normal.  Since her last visit, she underwent bilateral diagnostic mammography with tomography at [facility] on 06/14/2018 showing: breast density category C; no evidence of malignancy in either breast.  On 04/13/2018, she underwent total right hip replacement under Dr. Alvan Dame. She states she cannot run, but she continues to walk regularly.  She has no pain at present.   REVIEW OF SYSTEMS: Elaine reports a cough and attributes it to her lisinopril. She also reports some moodiness. A detailed review of systems was otherwise stable.    BREAST CANCER HISTORY: From the original intake note:  The patient had screening bilateral mammography showing an area of pleomorphic calcifications in the left breast upper outer quadrant. Diagnostic mammography of the left breast on 06/10/2016 at the [facility] showed the breast density to be category C. There was an area of grouped punctate and amorphus calcifications in the upper outer quadrant measuring up to 3.5 cm. 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  Her subsequent history is as detailed below   PAST MEDICAL HISTORY: Past Medical History:  Diagnosis Date  . Anxiety   . History of radiation therapy 08/19/16- 09/16/16   Left Breast 40/05 Gy in 15 fractions, Left Breast Boost 10 Gy in 5 fractions  . Hypertension   . Personal history of radiation therapy   . Pre-diabetes     PAST SURGICAL HISTORY: Past Surgical History:  Procedure Laterality Date  . BILATERAL SALPINGONEOSTOMY, LYSIS OF ADHESIONS, R OV CYSTECTOMY     per patient : questionable   . BREAST LUMPECTOMY Left 07/16/2016  . RADIOACTIVE SEED GUIDED PARTIAL MASTECTOMY WITH AXILLARY SENTINEL LYMPH NODE BIOPSY Left 07/16/2016   Procedure: RADIOACTIVE SEED GUIDED LEFT PARTIAL MASTECTOMY WITH AXILLARY SENTINEL LYMPH NODE BIOPSY;  Surgeon: Erroll Luna, MD;  Location: [facility];  Service: General;  Laterality: Left;  . TOTAL HIP ARTHROPLASTY Right 04/13/2018   Procedure: TOTAL HIP ARTHROPLASTY ANTERIOR APPROACH;  Surgeon: Paralee Cancel, MD;  Location: [facility];  Service: Orthopedics;  Laterality: Right;    FAMILY HISTORY Family History  Problem Relation Age of Onset  . Hypertension Mother   . Osteoporosis Mother   . Stroke Mother        RECENTLY DIED  . Hypertension Father   . Diabetes Father   . Heart disease Father   . Cancer Father        PROSTATE  . Hypertension Sister   . Hypertension Brother   . Cancer Brother        PROSTATE  . Breast cancer Neg Hx   The patient's father  was diagnosed with prostate cancer age 84. He died at age 48. The patient's mother died from not cancer related causes at age 79. The patient has a brother who was diagnosed with prostate cancer at age 45. She also has a sister. There is no history of ovarian cancer in the family.   GYNECOLOGIC HISTORY:  Patient's last menstrual period was  03/10/1997. Menarche age 42. The patient is GX P0. She does not recall exactly when she went through menopause. She did not take hormone replacement. She did use oral contraceptives approximately 5 years remotely with no side effects that she is aware of   SOCIAL HISTORY:  The patient is a Clinical cytogeneticist. At home it's just she and her husband [name]    ADVANCED DIRECTIVES: Not in place   HEALTH MAINTENANCE: Social History   Tobacco Use  . Smoking status: Never Smoker  . Smokeless tobacco: Never Used  Substance Use Topics  . Alcohol use: No    Alcohol/week: 0.0 standard drinks  . Drug use: No     Colonoscopy: 2017  PAP:  Bone density: Never   No Known Allergies  Current Outpatient Medications  Medication Sig Dispense Refill  . anastrozole (ARIMIDEX) 1 MG tablet TAKE 1 TABLET BY MOUTH  DAILY 90 tablet 0  . Ascorbic Acid (VITAMIN C) 1000 MG tablet Take 1,000 mg by mouth daily.    . Calcium Carb-Cholecalciferol (CALCIUM 600 + D PO) Take 1 tablet by mouth daily.    . calcium carbonate (OS-CAL - DOSED IN MG OF ELEMENTAL CALCIUM) 1250 (500 Ca) MG tablet Take 1 tablet by mouth daily.    . Cholecalciferol (VITAMIN D) 50 MCG (2000 UT) tablet Take 4,000 Units by mouth daily.    . Coenzyme Q10 300 MG CAPS Take 300 mg by mouth daily.    Marland Kitchen docusate sodium (COLACE) 100 MG capsule Take 1 capsule (100 mg total) by mouth 2 (two) times daily. 10 capsule 0  . ferrous sulfate (FERROUSUL) 325 (65 FE) MG tablet Take 1 tablet (325 mg total) by mouth 3 (three) times daily with meals.  3  . GARLIC PO Take 7,829 mg by mouth daily.     . Glucosamine HCl-MSM (GLUCOSAMINE-MSM PO) Take 2 tablets by mouth daily.    . hydrochlorothiazide (HYDRODIURIL) 25 MG tablet Take 25 mg by mouth daily.    Marland Kitchen HYDROcodone-acetaminophen (NORCO) 7.5-325 MG tablet Take 1-2 tablets by mouth every 4 (four) hours as needed for moderate pain. 60 tablet 0  . lisinopril (PRINIVIL,ZESTRIL) 20 MG tablet Take 20 mg by mouth  daily.    . methocarbamol (ROBAXIN) 500 MG tablet Take 1 tablet (500 mg total) by mouth every 6 (six) hours as needed for muscle spasms. 40 tablet 0  . Multiple Vitamin (MULTIVITAMIN WITH MINERALS) TABS tablet Take 1-2 tablets by mouth daily.    . Omega-3 Fatty Acids (FISH OIL) 1000 MG CAPS Take 1 capsule by mouth every other day.    . polyethylene glycol (MIRALAX / GLYCOLAX) packet Take 17 g by mouth 2 (two) times daily. 14 each 0  . TURMERIC PO Take 1 tablet by mouth daily.    Marland Kitchen zinc gluconate 50 MG tablet Take 50 mg by mouth daily.     No current facility-administered medications for this visit.     OBJECTIVE: Middle-aged African-American woman who appears stated age  12:   10/07/18 1530  BP: 118/70  Pulse: 70  Resp: 18  Temp: 98.9 F (37.2 C)  SpO2:  100%     Body mass index is 24.24 kg/m.    ECOG FS:1 - Symptomatic but completely ambulatory  Sclerae unicteric, EOMs intact Wearing a mask No cervical or supraclavicular adenopathy Lungs no rales or rhonchi Heart regular rate and rhythm Abd soft, nontender, positive bowel sounds MSK no focal spinal tenderness, no upper extremity lymphedema Neuro: nonfocal, well oriented, appropriate affect Breasts: The right breast is benign.  The left breast has undergone lumpectomy followed by radiation with no evidence of disease recurrence.  Both axillae are benign.  LAB RESULTS:  CMP     Component Value Date/Time   NA 139 10/07/2018 1514   NA 141 06/18/2016 1234   K 4.3 10/07/2018 1514   K 3.9 06/18/2016 1234   CL 103 10/07/2018 1514   CO2 29 10/07/2018 1514   CO2 28 06/18/2016 1234   GLUCOSE 101 (H) 10/07/2018 1514   GLUCOSE 89 06/18/2016 1234   BUN 18 10/07/2018 1514   BUN 16.6 06/18/2016 1234   CREATININE 1.16 (H) 10/07/2018 1514   CREATININE 1.0 06/18/2016 1234   CALCIUM 9.5 10/07/2018 1514   CALCIUM 9.8 06/18/2016 1234   PROT 7.4 10/07/2018 1514   PROT 7.4 06/18/2016 1234   ALBUMIN 3.9 10/07/2018 1514   ALBUMIN  3.9 06/18/2016 1234   AST 29 10/07/2018 1514   AST 22 06/18/2016 1234   ALT 34 10/07/2018 1514   ALT 22 06/18/2016 1234   ALKPHOS 104 10/07/2018 1514   ALKPHOS 80 06/18/2016 1234   BILITOT 0.4 10/07/2018 1514   BILITOT 0.43 06/18/2016 1234   GFRNONAA 48 (L) 10/07/2018 1514   GFRAA 56 (L) 10/07/2018 1514    No results found for: TOTALPROTELP, ALBUMINELP, A1GS, A2GS, BETS, BETA2SER, GAMS, MSPIKE, SPEI  No results found for: Nils Pyle, [facility]  Lab Results  Component Value Date   WBC 5.5 10/07/2018   NEUTROABS 3.2 10/07/2018   HGB 13.2 10/07/2018   HCT 39.8 10/07/2018   MCV 87.5 10/07/2018   PLT 235 10/07/2018      Chemistry      Component Value Date/Time   NA 139 10/07/2018 1514   NA 141 06/18/2016 1234   K 4.3 10/07/2018 1514   K 3.9 06/18/2016 1234   CL 103 10/07/2018 1514   CO2 29 10/07/2018 1514   CO2 28 06/18/2016 1234   BUN 18 10/07/2018 1514   BUN 16.6 06/18/2016 1234   CREATININE 1.16 (H) 10/07/2018 1514   CREATININE 1.0 06/18/2016 1234      Component Value Date/Time   CALCIUM 9.5 10/07/2018 1514   CALCIUM 9.8 06/18/2016 1234   ALKPHOS 104 10/07/2018 1514   ALKPHOS 80 06/18/2016 1234   AST 29 10/07/2018 1514   AST 22 06/18/2016 1234   ALT 34 10/07/2018 1514   ALT 22 06/18/2016 1234   BILITOT 0.4 10/07/2018 1514   BILITOT 0.43 06/18/2016 1234       No results found for: LABCA2  No components found for: ONGEXB284  No results for input(s): INR in the last 168 hours.  Urinalysis    Component Value Date/Time   COLORURINE YELLOW 02/25/2013 0846   APPEARANCEUR CLEAR 02/25/2013 0846   LABSPEC 1.006 02/25/2013 0846   PHURINE 6.5 02/25/2013 0846   GLUCOSEU NEG 02/25/2013 0846   HGBUR NEG 02/25/2013 0846   BILIRUBINUR NEG 02/25/2013 0846   KETONESUR NEG 02/25/2013 0846   PROTEINUR NEG 02/25/2013 0846   UROBILINOGEN 0.2 02/25/2013 0846   NITRITE NEG 02/25/2013 0846   LEUKOCYTESUR NEG 02/25/2013  0846     STUDIES: No results  found.   ELIGIBLE FOR AVAILABLE RESEARCH PROTOCOL: Not in candidate for the COMET trial because of necrosis   ASSESSMENT: 68 y.o. [place] woman status post left breast upper outer quadrant biopsy 06/11/2016 showing ductal carcinoma in situ, grade 2, estrogen and progesterone receptor positive  (1) genetics testing pending  (2) status post left lumpectomy and sentinel lymph node sampling 07/16/2016 for ductal carcinoma in situ, grade 2, with negative margins.  (3) adjuvant radiation 08/19/16 - 09/16/16 Site/dose:   1) Left Breast / 40.05 Gy in 15 fractions 2) Left Breast Boost / 10 Gy in 5 fractions  (4) started anastrozole 11/08/2016  (a[facility] showed a T score of -0.9  PLAN Madelynn is now a little over 2 years out from definitive surgery for her breast cancer with no evidence of disease recurrence very favorable.  She is tolerating the anastrozole well and the plan will be to continue that a total of 5 years.  She will need to have a repeat bone density next night, but most of the bone loss from aromatase inhibitors tends to occur in the first year and she is already well past that.  I encouraged her excellent exercise program  She is taking appropriate pandemic precautions.  She knows to call for any other issue that may develop before the next visit.   Tywone Bembenek, Virgie Dad, MD  10/07/18 4:10 PM Medical Oncology and Hematology [facility] [address] [place], [place] [address] Tel. [phone]    Fax. [phone]    I, Wilburn Mylar, am acting as scribe for Dr. Virgie Dad. Amariyana Heacox.  I, Lurline Del MD, have reviewed the above documentation for accuracy and completeness, and I agree with the above.

## 2018-10-07 ENCOUNTER — Inpatient Hospital Stay: Payer: 59 | Attending: Oncology | Admitting: Oncology

## 2018-10-07 ENCOUNTER — Inpatient Hospital Stay: Payer: 59

## 2018-10-07 ENCOUNTER — Other Ambulatory Visit: Payer: Self-pay

## 2018-10-07 VITALS — BP 118/70 | HR 70 | Temp 98.9°F | Resp 18 | Ht 65.5 in | Wt 147.9 lb

## 2018-10-07 DIAGNOSIS — N951 Menopausal and female climacteric states: Secondary | ICD-10-CM

## 2018-10-07 DIAGNOSIS — C50412 Malignant neoplasm of upper-outer quadrant of left female breast: Secondary | ICD-10-CM

## 2018-10-07 DIAGNOSIS — N898 Other specified noninflammatory disorders of vagina: Secondary | ICD-10-CM | POA: Diagnosis not present

## 2018-10-07 DIAGNOSIS — D0512 Intraductal carcinoma in situ of left breast: Secondary | ICD-10-CM | POA: Insufficient documentation

## 2018-10-07 DIAGNOSIS — R05 Cough: Secondary | ICD-10-CM

## 2018-10-07 DIAGNOSIS — Z170 Estrogen receptor positive status [ER+]: Secondary | ICD-10-CM | POA: Diagnosis not present

## 2018-10-07 DIAGNOSIS — Z96641 Presence of right artificial hip joint: Secondary | ICD-10-CM | POA: Insufficient documentation

## 2018-10-07 LAB — CBC WITH DIFFERENTIAL (CANCER CENTER ONLY)
Abs Immature Granulocytes: 0.01 10*3/uL (ref 0.00–0.07)
Basophils Absolute: 0 10*3/uL (ref 0.0–0.1)
Basophils Relative: 1 %
Eosinophils Absolute: 0.2 10*3/uL (ref 0.0–0.5)
Eosinophils Relative: 3 %
HCT: 39.8 % (ref 36.0–46.0)
Hemoglobin: 13.2 g/dL (ref 12.0–15.0)
Immature Granulocytes: 0 %
Lymphocytes Relative: 29 %
Lymphs Abs: 1.6 10*3/uL (ref 0.7–4.0)
MCH: 29 pg (ref 26.0–34.0)
MCHC: 33.2 g/dL (ref 30.0–36.0)
MCV: 87.5 fL (ref 80.0–100.0)
Monocytes Absolute: 0.5 10*3/uL (ref 0.1–1.0)
Monocytes Relative: 9 %
Neutro Abs: 3.2 10*3/uL (ref 1.7–7.7)
Neutrophils Relative %: 58 %
Platelet Count: 235 10*3/uL (ref 150–400)
RBC: 4.55 MIL/uL (ref 3.87–5.11)
RDW: 14 % (ref 11.5–15.5)
WBC Count: 5.5 10*3/uL (ref 4.0–10.5)
nRBC: 0 % (ref 0.0–0.2)

## 2018-10-07 LAB — CMP (CANCER CENTER ONLY)
ALT: 34 U/L (ref 0–44)
AST: 29 U/L (ref 15–41)
Albumin: 3.9 g/dL (ref 3.5–5.0)
Alkaline Phosphatase: 104 U/L (ref 38–126)
Anion gap: 7 (ref 5–15)
BUN: 18 mg/dL (ref 8–23)
CO2: 29 mmol/L (ref 22–32)
Calcium: 9.5 mg/dL (ref 8.9–10.3)
Chloride: 103 mmol/L (ref 98–111)
Creatinine: 1.16 mg/dL — ABNORMAL HIGH (ref 0.44–1.00)
GFR, Est AFR Am: 56 mL/min — ABNORMAL LOW (ref >60)
GFR, Estimated: 48 mL/min — ABNORMAL LOW (ref >60)
Glucose, Bld: 101 mg/dL — ABNORMAL HIGH (ref 70–99)
Potassium: 4.3 mmol/L (ref 3.5–5.1)
Sodium: 139 mmol/L (ref 135–145)
Total Bilirubin: 0.4 mg/dL (ref 0.3–1.2)
Total Protein: 7.4 g/dL (ref 6.5–8.1)

## 2018-10-07 MED ORDER — ANASTROZOLE 1 MG PO TABS: 1.0000 mg | ORAL_TABLET | Freq: Every day | ORAL | 4 refills | Status: DC

## 2018-10-08 ENCOUNTER — Telehealth: Payer: Self-pay | Admitting: Oncology

## 2018-10-08 NOTE — Telephone Encounter (Signed)
I left a message regarding schedule  

## 2018-12-27 ENCOUNTER — Other Ambulatory Visit: Payer: Self-pay

## 2018-12-27 ENCOUNTER — Ambulatory Visit
Admission: RE | Admit: 2018-12-27 | Discharge: 2018-12-27 | Disposition: A | Discharge status: Home / Self Care | Payer: 59 | Source: Ambulatory Visit | Attending: Oncology | Admitting: Oncology

## 2018-12-27 DIAGNOSIS — Z170 Estrogen receptor positive status [ER+]: Secondary | ICD-10-CM

## 2018-12-27 DIAGNOSIS — C50412 Malignant neoplasm of upper-outer quadrant of left female breast: Secondary | ICD-10-CM

## 2018-12-28 ENCOUNTER — Telehealth: Payer: Self-pay

## 2018-12-28 NOTE — Telephone Encounter (Signed)
-----   Message from Gardenia Phlegm, NP sent at 12/28/2018  1:54 PM EDT ----- Bone density is normal.  Please call patient with results ----- Message ----- From: Interface, Rad Results In Sent: 12/27/2018   1:20 PM EDT To: Chauncey Cruel, MD

## 2018-12-28 NOTE — Telephone Encounter (Signed)
Spoke with patient to inform of normal BD results.  Patient voiced understanding and thanks for call.  Knows to call with any new issues.

## 2019-05-17 ENCOUNTER — Other Ambulatory Visit: Payer: Self-pay | Admitting: Oncology

## 2019-05-17 DIAGNOSIS — Z9889 Other specified postprocedural states: Secondary | ICD-10-CM

## 2019-05-17 DIAGNOSIS — Z853 Personal history of malignant neoplasm of breast: Secondary | ICD-10-CM

## 2019-06-27 ENCOUNTER — Inpatient Hospital Stay: Admission: RE | Admit: 2019-06-27 | Payer: 59 | Source: Ambulatory Visit

## 2019-06-28 ENCOUNTER — Ambulatory Visit
Admission: RE | Admit: 2019-06-28 | Discharge: 2019-06-28 | Disposition: A | Discharge status: Home / Self Care | Payer: 59 | Source: Ambulatory Visit | Attending: Oncology | Admitting: Oncology

## 2019-06-28 DIAGNOSIS — Z9889 Other specified postprocedural states: Secondary | ICD-10-CM

## 2019-06-28 DIAGNOSIS — Z853 Personal history of malignant neoplasm of breast: Secondary | ICD-10-CM

## 2019-10-09 NOTE — Progress Notes (Signed)
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  She tells me she feels terrific and thanks got every day for her health  She continues on anastrozole, with good tolerance.  Hot flashes are minimal.  She gets headaches sometimes with her likely not related to this medication.  Since her last visit, she underwent repeat bone density screening on 12/27/2018 showing a T-score of -0.7, which is considered normal.  She also underwent bilateral diagnostic mammography with tomography at [facility] on 06/28/2019 showing: breast density category C; no evidence of malignancy in either breast.   REVIEW OF SYSTEMS: Marielena walks about 5 miles most days.  She feels "wonderful".  She thinks he may be having some sinus allergies.  A detailed review of systems today was otherwise stable   BREAST CANCER HISTORY: From the original intake note:  The patient had screening bilateral mammography showing an area of pleomorphic calcifications in the left breast upper outer quadrant. Diagnostic mammography of the left breast on 06/10/2016 at the [facility] showed the breast density to be category C. There was an area of grouped punctate and amorphus calcifications in the upper outer quadrant measuring up to 3.5 cm. 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  Her subsequent history is as detailed below   PAST MEDICAL HISTORY: Past Medical History:  Diagnosis Date  . Anxiety   . History of radiation therapy 08/19/16- 09/16/16   Left Breast 40/05 Gy in 15 fractions, Left Breast Boost 10 Gy in 5 fractions  . Hypertension   . Personal history of radiation therapy   . Pre-diabetes     PAST SURGICAL HISTORY: Past Surgical History:  Procedure Laterality Date  . BILATERAL SALPINGONEOSTOMY, LYSIS OF ADHESIONS, R OV CYSTECTOMY     per patient : questionable   . BREAST LUMPECTOMY Left 07/16/2016  . RADIOACTIVE SEED GUIDED PARTIAL MASTECTOMY WITH AXILLARY SENTINEL LYMPH NODE BIOPSY Left 07/16/2016   Procedure: RADIOACTIVE SEED GUIDED LEFT PARTIAL MASTECTOMY WITH AXILLARY SENTINEL LYMPH NODE BIOPSY;  Surgeon: Erroll Luna, MD;  Location: [facility];  Service: General;  Laterality: Left;  . TOTAL HIP ARTHROPLASTY Right 04/13/2018   Procedure: TOTAL HIP ARTHROPLASTY ANTERIOR APPROACH;  Surgeon: Paralee Cancel, MD;  Location: [facility];  Service: Orthopedics;  Laterality: Right;    FAMILY HISTORY Family History  Problem Relation Age of Onset  . Hypertension Mother   . Osteoporosis Mother   . Stroke Mother        RECENTLY DIED  . Hypertension Father   . Diabetes Father   . Heart disease Father   . Cancer Father        PROSTATE  . Hypertension Sister   . Hypertension Brother   . Cancer Brother        PROSTATE  . Breast cancer Neg Hx   The patient's father was diagnosed with  prostate cancer age 64. He died at age 36. The patient's mother died from not cancer related causes at age 23. The patient has a brother who was diagnosed with prostate cancer at age 59. She also has a sister. There is no history of ovarian cancer in the family.   GYNECOLOGIC HISTORY:  Patient's last menstrual period was 03/10/1997. Menarche age 37. The patient is  GX P0. She does not recall exactly when she went through menopause. She did not take hormone replacement. 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    HEALTH MAINTENANCE: Social History   Tobacco Use  . Smoking status: Never Smoker  . Smokeless tobacco: Never Used  Vaping Use  . Vaping Use: Never used  Substance Use Topics  . Alcohol use: No    Alcohol/week: 0.0 standard drinks  . Drug use: No     Colonoscopy: 2017  PAP:  Bone density: Never   No Known Allergies  Current Outpatient Medications  Medication Sig Dispense Refill  . anastrozole (ARIMIDEX) 1 MG tablet Take 1 tablet (1 mg total) by mouth daily. 90 tablet 4  . Ascorbic Acid (VITAMIN C) 1000 MG tablet Take 1,000 mg by mouth daily.    . Calcium Carb-Cholecalciferol (CALCIUM 600 + D PO) Take 1 tablet by mouth daily.    . calcium carbonate (OS-CAL - DOSED IN MG OF ELEMENTAL CALCIUM) 1250 (500 Ca) MG tablet Take 1 tablet by mouth daily.    . Cholecalciferol (VITAMIN D) 50 MCG (2000 UT) tablet Take 4,000 Units by mouth daily.    . Coenzyme Q10 300 MG CAPS Take 300 mg by mouth daily.    . ferrous sulfate (FERROUSUL) 325 (65 FE) MG tablet Take 1 tablet (325 mg total) by mouth 3 (three) times daily with meals.  3  . GARLIC PO Take 4,098 mg by mouth daily.     . Glucosamine HCl-MSM (GLUCOSAMINE-MSM PO) Take 2 tablets by mouth daily.    . hydrochlorothiazide (HYDRODIURIL) 25 MG tablet Take 25 mg by mouth daily.    Marland Kitchen lisinopril (PRINIVIL,ZESTRIL) 20 MG tablet Take 20 mg by mouth daily.    . Multiple Vitamin (MULTIVITAMIN WITH MINERALS) TABS tablet Take 1-2 tablets by mouth daily.    . Omega-3 Fatty Acids (FISH OIL) 1000 MG CAPS Take 1 capsule by mouth  every other day.    . TURMERIC PO Take 1 tablet by mouth daily.    Marland Kitchen zinc gluconate 50 MG tablet Take 50 mg by mouth daily.     No current facility-administered medications for this visit.    OBJECTIVE: African-American woman who appears stated age  28:   10/10/19 1430  BP: 112/74  Pulse: 80  Resp: 18  Temp: 98.3 F (36.8 C)  SpO2: 99%     Body mass index is 23.34 kg/m.    ECOG FS:1 - Symptomatic but completely ambulatory  Sclerae unicteric, EOMs intact Wearing a mask No cervical or supraclavicular adenopathy Lungs no rales or rhonchi Heart regular rate and rhythm Abd soft, nontender, positive bowel sounds MSK no focal spinal tenderness, no upper extremity lymphedema Neuro: nonfocal, well oriented, appropriate affect Breasts: The right breast is unremarkable.  The left breast  is status post lumpectomy and radiation.  There is no evidence of local recurrence.  Both axillae are benign.   LAB RESULTS:  CMP     Component Value Date/Time   NA 139 10/07/2018 1514   NA 141 06/18/2016 1234   K 4.3 10/07/2018 1514   K 3.9 06/18/2016 1234   CL 103 10/07/2018 1514   CO2 29 10/07/2018 1514   CO2 28 06/18/2016 1234   GLUCOSE 101 (H) 10/07/2018 1514   GLUCOSE 89 06/18/2016 1234   BUN 18 10/07/2018 1514   BUN 16.6 06/18/2016 1234   CREATININE 1.16 (H) 10/07/2018 1514   CREATININE 1.0 06/18/2016 1234   CALCIUM 9.5 10/07/2018 1514   CALCIUM 9.8 06/18/2016 1234   PROT 7.4 10/07/2018 1514   PROT 7.4 06/18/2016 1234   ALBUMIN 3.9 10/07/2018 1514   ALBUMIN 3.9 06/18/2016 1234   AST 29 10/07/2018 1514   AST 22 06/18/2016 1234   ALT 34 10/07/2018 1514   ALT 22 06/18/2016 1234   ALKPHOS 104 10/07/2018 1514   ALKPHOS 80 06/18/2016 1234   BILITOT 0.4 10/07/2018 1514   BILITOT 0.43 06/18/2016 1234   GFRNONAA 48 (L) 10/07/2018 1514   GFRAA 56 (L) 10/07/2018 1514    No results found for: TOTALPROTELP, ALBUMINELP, A1GS, A2GS, BETS, BETA2SER, GAMS, MSPIKE, SPEI  No results  found for: Nils Pyle, [facility]  Lab Results  Component Value Date   WBC 4.1 10/10/2019   NEUTROABS 2.0 10/10/2019   HGB 13.3 10/10/2019   HCT 39.5 10/10/2019   MCV 87.4 10/10/2019   PLT 218 10/10/2019      Chemistry      Component Value Date/Time   NA 139 10/07/2018 1514   NA 141 06/18/2016 1234   K 4.3 10/07/2018 1514   K 3.9 06/18/2016 1234   CL 103 10/07/2018 1514   CO2 29 10/07/2018 1514   CO2 28 06/18/2016 1234   BUN 18 10/07/2018 1514   BUN 16.6 06/18/2016 1234   CREATININE 1.16 (H) 10/07/2018 1514   CREATININE 1.0 06/18/2016 1234      Component Value Date/Time   CALCIUM 9.5 10/07/2018 1514   CALCIUM 9.8 06/18/2016 1234   ALKPHOS 104 10/07/2018 1514   ALKPHOS 80 06/18/2016 1234   AST 29 10/07/2018 1514   AST 22 06/18/2016 1234   ALT 34 10/07/2018 1514   ALT 22 06/18/2016 1234   BILITOT 0.4 10/07/2018 1514   BILITOT 0.43 06/18/2016 1234       No results found for: LABCA2  No components found for: [id number]  No results for input(s): INR in the last 168 hours.  Urinalysis    Component Value Date/Time   COLORURINE YELLOW 02/25/2013 0846   APPEARANCEUR CLEAR 02/25/2013 0846   LABSPEC 1.006 02/25/2013 0846   PHURINE 6.5 02/25/2013 0846   GLUCOSEU NEG 02/25/2013 0846   HGBUR NEG 02/25/2013 0846   BILIRUBINUR NEG 02/25/2013 0846   KETONESUR NEG 02/25/2013 0846   PROTEINUR NEG 02/25/2013 0846   UROBILINOGEN 0.2 02/25/2013 0846   NITRITE NEG 02/25/2013 0846   LEUKOCYTESUR NEG 02/25/2013 0846    STUDIES: No results found.   ELIGIBLE FOR AVAILABLE RESEARCH PROTOCOL: Not in candidate for the COMET trial because of necrosis   ASSESSMENT: 69 y.o. [place] woman status post left breast upper outer quadrant biopsy 06/11/2016 showing ductal carcinoma in situ, grade 2, estrogen and progesterone receptor positive  (1) genetics testing pending  (2) status post left lumpectomy and sentinel lymph node sampling 07/16/2016 for ductal  carcinoma in situ, grade 2, with negative margins.  (3) adjuvant radiation 08/19/16 - 09/16/16 Site/dose:   1) Left Breast / 40.05 Gy in 15 fractions 2) Left Breast Boost / 10 Gy in 5 fractions  (4) started anastrozole 11/08/2016  ([facility] showed a T score of -0.9   PLAN Donnia is now a little over 3 years out from definitive surgery for her breast cancer with no evidence of disease recurrence.  This is very favorable.  She is tolerating anastrozole well and the plan is to continue that a total of 5 years which will take [place] to August 2023  I commended her excellent exercise program  She knows to call for any other issue that may develop before the next visit.  Total encounter time 20 minutes.  Thadeus Gandolfi, Virgie Dad, MD  10/10/19 2:45 PM Medical Oncology and Hematology [facility] [place], [place] [address] Tel. [phone]    Fax. [phone]    I, Wilburn Mylar, am acting as scribe for Dr. Virgie Dad. Amauri Medellin.  I, Lurline Del MD, have reviewed the above documentation for accuracy and completeness, and I agree with the above.   *Total Encounter Time as defined by the Centers for Medicare and Medicaid Services includes, in addition to the face-to-face time of a patient visit (documented in the note above) non-face-to-face time: obtaining and reviewing outside history, ordering and reviewing medications, tests or procedures, care coordination (communications with other health care professionals or caregivers) and documentation in the medical record.

## 2019-10-10 ENCOUNTER — Inpatient Hospital Stay: Payer: 59 | Attending: Oncology | Admitting: Oncology

## 2019-10-10 ENCOUNTER — Inpatient Hospital Stay: Payer: 59

## 2019-10-10 ENCOUNTER — Other Ambulatory Visit: Payer: Self-pay | Admitting: *Deleted

## 2019-10-10 ENCOUNTER — Other Ambulatory Visit: Payer: Self-pay

## 2019-10-10 VITALS — BP 112/74 | HR 80 | Temp 98.3°F | Resp 18 | Ht 65.5 in | Wt 142.4 lb

## 2019-10-10 DIAGNOSIS — D0512 Intraductal carcinoma in situ of left breast: Secondary | ICD-10-CM | POA: Diagnosis not present

## 2019-10-10 DIAGNOSIS — Z170 Estrogen receptor positive status [ER+]: Secondary | ICD-10-CM

## 2019-10-10 DIAGNOSIS — C50412 Malignant neoplasm of upper-outer quadrant of left female breast: Secondary | ICD-10-CM

## 2019-10-10 LAB — CMP (CANCER CENTER ONLY)
ALT: 23 U/L (ref 0–44)
AST: 23 U/L (ref 15–41)
Albumin: 4 g/dL (ref 3.5–5.0)
Alkaline Phosphatase: 83 U/L (ref 38–126)
Anion gap: 9 (ref 5–15)
BUN: 17 mg/dL (ref 8–23)
CO2: 28 mmol/L (ref 22–32)
Calcium: 10.3 mg/dL (ref 8.9–10.3)
Chloride: 103 mmol/L (ref 98–111)
Creatinine: 1.27 mg/dL — ABNORMAL HIGH (ref 0.44–1.00)
GFR, Est AFR Am: 50 mL/min — ABNORMAL LOW (ref >60)
GFR, Estimated: 43 mL/min — ABNORMAL LOW (ref >60)
Glucose, Bld: 103 mg/dL — ABNORMAL HIGH (ref 70–99)
Potassium: 4.2 mmol/L (ref 3.5–5.1)
Sodium: 140 mmol/L (ref 135–145)
Total Bilirubin: 0.6 mg/dL (ref 0.3–1.2)
Total Protein: 7.5 g/dL (ref 6.5–8.1)

## 2019-10-10 LAB — CBC WITH DIFFERENTIAL (CANCER CENTER ONLY)
Abs Immature Granulocytes: 0 10*3/uL (ref 0.00–0.07)
Basophils Absolute: 0 10*3/uL (ref 0.0–0.1)
Basophils Relative: 1 %
Eosinophils Absolute: 0.1 10*3/uL (ref 0.0–0.5)
Eosinophils Relative: 2 %
HCT: 39.5 % (ref 36.0–46.0)
Hemoglobin: 13.3 g/dL (ref 12.0–15.0)
Immature Granulocytes: 0 %
Lymphocytes Relative: 40 %
Lymphs Abs: 1.6 10*3/uL (ref 0.7–4.0)
MCH: 29.4 pg (ref 26.0–34.0)
MCHC: 33.7 g/dL (ref 30.0–36.0)
MCV: 87.4 fL (ref 80.0–100.0)
Monocytes Absolute: 0.4 10*3/uL (ref 0.1–1.0)
Monocytes Relative: 11 %
Neutro Abs: 2 10*3/uL (ref 1.7–7.7)
Neutrophils Relative %: 46 %
Platelet Count: 218 10*3/uL (ref 150–400)
RBC: 4.52 MIL/uL (ref 3.87–5.11)
RDW: 13.1 % (ref 11.5–15.5)
WBC Count: 4.1 10*3/uL (ref 4.0–10.5)
nRBC: 0 % (ref 0.0–0.2)

## 2019-10-10 MED ORDER — ANASTROZOLE 1 MG PO TABS: 1.0000 mg | ORAL_TABLET | Freq: Every day | ORAL | 4 refills | Status: DC

## 2019-10-11 ENCOUNTER — Telehealth: Payer: Self-pay | Admitting: Oncology

## 2019-10-11 NOTE — Telephone Encounter (Signed)
Scheduled appts per 8/2 los. Pt confirmed appt date and time.

## 2020-03-19 ENCOUNTER — Other Ambulatory Visit: Payer: 59

## 2020-06-20 ENCOUNTER — Other Ambulatory Visit: Payer: Self-pay | Admitting: Oncology

## 2020-06-20 DIAGNOSIS — Z1231 Encounter for screening mammogram for malignant neoplasm of breast: Secondary | ICD-10-CM

## 2020-07-11 ENCOUNTER — Other Ambulatory Visit: Payer: Self-pay | Admitting: Oncology

## 2020-07-11 DIAGNOSIS — Z853 Personal history of malignant neoplasm of breast: Secondary | ICD-10-CM

## 2020-07-16 ENCOUNTER — Ambulatory Visit
Admission: RE | Admit: 2020-07-16 | Discharge: 2020-07-16 | Disposition: A | Discharge status: Home / Self Care | Payer: 59 | Source: Ambulatory Visit | Attending: Oncology | Admitting: Oncology

## 2020-07-16 ENCOUNTER — Other Ambulatory Visit: Payer: Self-pay

## 2020-07-16 DIAGNOSIS — Z853 Personal history of malignant neoplasm of breast: Secondary | ICD-10-CM

## 2020-10-08 ENCOUNTER — Other Ambulatory Visit: Payer: Self-pay | Admitting: *Deleted

## 2020-10-08 DIAGNOSIS — C50412 Malignant neoplasm of upper-outer quadrant of left female breast: Secondary | ICD-10-CM

## 2020-10-08 DIAGNOSIS — Z170 Estrogen receptor positive status [ER+]: Secondary | ICD-10-CM

## 2020-10-09 NOTE — Progress Notes (Signed)
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    She continues on anastrozole, with good tolerance.  Hot flashes are minimal.  She does have some vaginal dryness issues but these are minor.  Her most recent bone density screening on 12/27/2018 showing a T-score of -0.7, which is considered normal.  Since her last visit, she underwent bilateral diagnostic mammography with tomography at [facility] on 07/16/2020 showing: breast density category D; no evidence of malignancy in either breast.   REVIEW OF SYSTEMS: Angelica West is doing "fantastic".  She walks about 5 miles most days.  Twice a week she does [employer] outside with 10 friends and she is generally extremely active.  A detailed review of systems today was otherwise stable   COVID 19 VACCINATION STATUS: Status post Pfizer x2 followed by 1 booster   BREAST CANCER HISTORY: From the original intake note:  The patient had screening bilateral mammography showing an area of pleomorphic calcifications in the left breast upper outer quadrant. Diagnostic mammography of the left breast on 06/10/2016 at the [facility] showed the breast density to be category C. There was an area of grouped punctate and amorphus calcifications in the upper outer quadrant measuring up to 3.5 cm. Biopsy of this area 06/11/2016 showed (SAA [id number]) ductal  carcinoma in situ, grade 2, estrogen receptor 85% positive, progesterone receptor 5% positive, with strong staining intensity.  Her subsequent history is as detailed below   PAST MEDICAL HISTORY: Past Medical History:  Diagnosis Date   Anxiety    History of radiation therapy 08/19/16- 09/16/16   Left Breast 40/05 Gy in 15 fractions, Left Breast Boost 10 Gy in 5 fractions   Hypertension    Personal history of radiation therapy    Pre-diabetes     PAST SURGICAL HISTORY: Past Surgical History:  Procedure Laterality Date   BILATERAL SALPINGONEOSTOMY, LYSIS OF ADHESIONS, R OV CYSTECTOMY     per patient : questionable    BREAST LUMPECTOMY Left 07/16/2016   RADIOACTIVE SEED GUIDED PARTIAL MASTECTOMY WITH AXILLARY SENTINEL LYMPH NODE BIOPSY Left 07/16/2016   Procedure: RADIOACTIVE SEED GUIDED LEFT PARTIAL MASTECTOMY WITH AXILLARY SENTINEL LYMPH NODE BIOPSY;  Surgeon: Erroll Luna, MD;  Location: [facility];  Service: General;  Laterality: Left;   TOTAL HIP ARTHROPLASTY Right 04/13/2018   Procedure: TOTAL HIP ARTHROPLASTY ANTERIOR APPROACH;  Surgeon: Paralee Cancel, MD;  Location[facility];  Service: Orthopedics;  Laterality: Right;    FAMILY HISTORY Family History  Problem Relation Age of Onset   Hypertension Mother    Osteoporosis Mother    Stroke Mother        RECENTLY DIED   Hypertension Father    Diabetes Father    Heart disease Father    Cancer Father        PROSTATE   Hypertension Sister    Hypertension Brother    Cancer Brother        PROSTATE   Breast cancer Neg  Hx   The patient's father was diagnosed with prostate cancer age 65. He died at age 67. The patient's mother died from not cancer related causes at age 80. The patient has a brother who was diagnosed with prostate cancer at age 87. She also has a sister. There is no history of ovarian cancer in the family.   GYNECOLOGIC HISTORY:  Patient's last menstrual period was 03/10/1997. Menarche age 70. The  patient is GX P0. She does not recall exactly when she went through menopause. She did not take hormone replacement. 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    HEALTH MAINTENANCE: Social History   Tobacco Use   Smoking status: Never   Smokeless tobacco: Never  Vaping Use   Vaping Use: Never used  Substance Use Topics   Alcohol use: No    Alcohol/week: 0.0 standard drinks   Drug use: No     Colonoscopy: 2017  PAP:  Bone density: Never   No Known Allergies  Current Outpatient Medications  Medication Sig Dispense Refill   anastrozole (ARIMIDEX) 1 MG tablet Take 1 tablet (1 mg total) by mouth daily. 90 tablet 4   Ascorbic Acid (VITAMIN C) 1000 MG tablet Take 1,000 mg by mouth daily.     Calcium Carb-Cholecalciferol (CALCIUM 600 + D PO) Take 1 tablet by mouth daily.     calcium carbonate (OS-CAL - DOSED IN MG OF ELEMENTAL CALCIUM) 1250 (500 Ca) MG tablet Take 1 tablet by mouth daily.     Cholecalciferol (VITAMIN D) 50 MCG (2000 UT) tablet Take 4,000 Units by mouth daily.     Coenzyme Q10 300 MG CAPS Take 300 mg by mouth daily.     ferrous sulfate (FERROUSUL) 325 (65 FE) MG tablet Take 1 tablet (325 mg total) by mouth 3 (three) times daily with meals.  3   GARLIC PO Take [id number] mg by mouth daily.      Glucosamine HCl-MSM (GLUCOSAMINE-MSM PO) Take 2 tablets by mouth daily.     hydrochlorothiazide (HYDRODIURIL) 25 MG tablet Take 25 mg by mouth daily.     lisinopril (PRINIVIL,ZESTRIL) 20 MG tablet Take 20 mg by mouth daily.     Multiple Vitamin (MULTIVITAMIN WITH MINERALS) TABS tablet Take 1-2 tablets by mouth daily.     Omega-3 Fatty Acids (FISH OIL) 1000 MG CAPS Take 1 capsule by mouth every other day.      TURMERIC PO Take 1 tablet by mouth daily.     zinc gluconate 50 MG tablet Take 50 mg by mouth daily.     No current facility-administered medications for this visit.    OBJECTIVE: African-American woman who appears younger than stated age 70  10:   10/10/20 0923  BP: 112/63  Pulse: 63  Resp: 18  Temp: (!) 97.5 F (36.4 C)  SpO2: 100%      Body mass index is 23.58 kg/m.    ECOG FS:1 - Symptomatic but completely ambulatory  Sclerae unicteric, EOMs intact Wearing a mask No cervical or supraclavicular adenopathy Lungs no rales or rhonchi Heart regular rate and rhythm Abd soft, nontender, positive bowel sounds MSK no focal spinal tenderness, no upper extremity lymphedema Neuro: nonfocal, well oriented, appropriate  affect Breasts: The right breast is benign.  The left breast is status postlumpectomy and radiation with an excellent cosmetic result.  There is no evidence of local recurrence.  Both axillae are benign.   LAB RESULTS:  CMP     Component Value Date/Time   NA 141 10/10/2020 0856   NA 141 06/18/2016 1234   K 3.9 10/10/2020 0856   K 3.9 06/18/2016 1234   CL 102 10/10/2020 0856   CO2 30 10/10/2020 0856   CO2 28 06/18/2016 1234   GLUCOSE 91 10/10/2020 0856   GLUCOSE 89 06/18/2016 1234   BUN 18 10/10/2020 0856   BUN 16.6 06/18/2016 1234   CREATININE 1.09 (H) 10/10/2020 0856   CREATININE 1.0 06/18/2016 1234   CALCIUM 9.8 10/10/2020 0856   CALCIUM 9.8 06/18/2016 1234   PROT 7.4 10/10/2020 0856   PROT 7.4 06/18/2016 1234   ALBUMIN 4.0 10/10/2020 0856   ALBUMIN 3.9 06/18/2016 1234   AST 27 10/10/2020 0856   AST 22 06/18/2016 1234   ALT 26 10/10/2020 0856   ALT 22 06/18/2016 1234   ALKPHOS 86 10/10/2020 0856   ALKPHOS 80 06/18/2016 1234   BILITOT 0.6 10/10/2020 0856   BILITOT 0.43 06/18/2016 1234   GFRNONAA 55 (L) 10/10/2020 0856   GFRAA 50 (L) 10/10/2019 1420    No results found for: Ronnald Ramp, A1GS, A2GS, BETS, BETA2SER, GAMS, MSPIKE,  SPEI  No results found for: Nils Pyle, [facility]  Lab Results  Component Value Date   WBC 3.7 (L) 10/10/2020   NEUTROABS 2.1 10/10/2020   HGB 13.5 10/10/2020   HCT 40.6 10/10/2020   MCV 89.0 10/10/2020   PLT 205 10/10/2020      Chemistry      Component Value Date/Time   NA 141 10/10/2020 0856   NA 141 06/18/2016 1234   K 3.9 10/10/2020 0856   K 3.9 06/18/2016 1234   CL 102 10/10/2020 0856   CO2 30 10/10/2020 0856   CO2 28 06/18/2016 1234   BUN 18 10/10/2020 0856   BUN 16.6 06/18/2016 1234   CREATININE 1.09 (H) 10/10/2020 0856   CREATININE 1.0 06/18/2016 1234      Component Value Date/Time   CALCIUM 9.8 10/10/2020 0856   CALCIUM 9.8 06/18/2016 1234   ALKPHOS 86 10/10/2020 0856   ALKPHOS 80 06/18/2016 1234   AST 27 10/10/2020 0856   AST 22 06/18/2016 1234   ALT 26 10/10/2020 0856   ALT 22 06/18/2016 1234   BILITOT 0.6 10/10/2020 0856   BILITOT 0.43 06/18/2016 1234       No results found for: LABCA2  No components found for: [id number]  No results for input(s): INR in the last 168 hours.  Urinalysis    Component Value Date/Time   COLORURINE YELLOW 02/25/2013 0846   APPEARANCEUR CLEAR 02/25/2013 0846   LABSPEC 1.006 02/25/2013 0846   PHURINE 6.5 02/25/2013 0846   GLUCOSEU NEG 02/25/2013 0846   HGBUR NEG 02/25/2013 0846   BILIRUBINUR NEG 02/25/2013 0846   KETONESUR NEG 02/25/2013 0846   PROTEINUR NEG 02/25/2013 0846   UROBILINOGEN 0.2 02/25/2013 0846   NITRITE NEG 02/25/2013 0846   LEUKOCYTESUR NEG 02/25/2013 0846    STUDIES: No results found.   ELIGIBLE FOR AVAILABLE RESEARCH PROTOCOL: Not in candidate for the COMET trial because of necrosis   ASSESSMENT: 70 y.o. [place] woman status post left breast upper outer quadrant biopsy 06/11/2016 showing ductal carcinoma in situ, grade 2, estrogen and progesterone receptor positive  (1) genetics testing  pending  (2) status post left lumpectomy and sentinel lymph node sampling  07/16/2016 for ductal carcinoma in situ, grade 2, with negative margins.  (3) adjuvant radiation 08/19/16 - 09/16/16  Site/dose:   1) Left Breast / 40.05 Gy in 15 fractions 2) Left Breast Boost / 10 Gy in 5 fractions  (4) started anastrozole 11/08/2016  (a[facility] showed a T score of -0.9   PLAN Genelll is now a little over 4 years out from definitive surgery for her breast cancer with no evidence of disease recurrence.  This is very favorable.  She is tolerating anastrozole well and the plan is to continue that for another year so she can complete her 5 years.  I have written for her mammography in May 2023 and bone density scan the same day.  She will see [place] again in August 2023 and that will be her "graduation" visit * Total encounter time 20 minutes.  Carrigan Delafuente, Virgie Dad, MD  10/10/20 9:49 AM Medical Oncology and Hematology [facility] [place], [place] [address] Tel. [phone]    Fax. [phone]    I, Wilburn Mylar, am acting as scribe for Dr. Virgie Dad. [place].  I, Lurline Del MD, have reviewed the above documentation for accuracy and completeness, and I agree with the above.   *Total Encounter Time as defined by the Centers for Medicare and Medicaid Services includes, in addition to the face-to-face time of a patient visit (documented in the note above) non-face-to-face time: obtaining and reviewing outside history, ordering and reviewing medications, tests or procedures, care coordination (communications with other health care professionals or caregivers) and documentation in the medical record.

## 2020-10-10 ENCOUNTER — Other Ambulatory Visit: Payer: Self-pay

## 2020-10-10 ENCOUNTER — Inpatient Hospital Stay: Payer: 59 | Attending: Oncology | Admitting: Oncology

## 2020-10-10 ENCOUNTER — Inpatient Hospital Stay: Payer: 59

## 2020-10-10 VITALS — BP 112/63 | HR 63 | Temp 97.5°F | Resp 18 | Ht 65.5 in | Wt 143.9 lb

## 2020-10-10 DIAGNOSIS — Z170 Estrogen receptor positive status [ER+]: Secondary | ICD-10-CM

## 2020-10-10 DIAGNOSIS — D0512 Intraductal carcinoma in situ of left breast: Secondary | ICD-10-CM | POA: Diagnosis not present

## 2020-10-10 DIAGNOSIS — C50412 Malignant neoplasm of upper-outer quadrant of left female breast: Secondary | ICD-10-CM

## 2020-10-10 DIAGNOSIS — Z923 Personal history of irradiation: Secondary | ICD-10-CM | POA: Diagnosis not present

## 2020-10-10 LAB — CBC WITH DIFFERENTIAL (CANCER CENTER ONLY)
Abs Immature Granulocytes: 0.01 10*3/uL (ref 0.00–0.07)
Basophils Absolute: 0 10*3/uL (ref 0.0–0.1)
Basophils Relative: 1 %
Eosinophils Absolute: 0.1 10*3/uL (ref 0.0–0.5)
Eosinophils Relative: 2 %
HCT: 40.6 % (ref 36.0–46.0)
Hemoglobin: 13.5 g/dL (ref 12.0–15.0)
Immature Granulocytes: 0 %
Lymphocytes Relative: 32 %
Lymphs Abs: 1.2 10*3/uL (ref 0.7–4.0)
MCH: 29.6 pg (ref 26.0–34.0)
MCHC: 33.3 g/dL (ref 30.0–36.0)
MCV: 89 fL (ref 80.0–100.0)
Monocytes Absolute: 0.3 10*3/uL (ref 0.1–1.0)
Monocytes Relative: 8 %
Neutro Abs: 2.1 10*3/uL (ref 1.7–7.7)
Neutrophils Relative %: 57 %
Platelet Count: 205 10*3/uL (ref 150–400)
RBC: 4.56 MIL/uL (ref 3.87–5.11)
RDW: 13.2 % (ref 11.5–15.5)
WBC Count: 3.7 10*3/uL — ABNORMAL LOW (ref 4.0–10.5)
nRBC: 0 % (ref 0.0–0.2)

## 2020-10-10 LAB — CMP (CANCER CENTER ONLY)
ALT: 26 U/L (ref 0–44)
AST: 27 U/L (ref 15–41)
Albumin: 4 g/dL (ref 3.5–5.0)
Alkaline Phosphatase: 86 U/L (ref 38–126)
Anion gap: 9 (ref 5–15)
BUN: 18 mg/dL (ref 8–23)
CO2: 30 mmol/L (ref 22–32)
Calcium: 9.8 mg/dL (ref 8.9–10.3)
Chloride: 102 mmol/L (ref 98–111)
Creatinine: 1.09 mg/dL — ABNORMAL HIGH (ref 0.44–1.00)
GFR, Estimated: 55 mL/min — ABNORMAL LOW (ref >60)
Glucose, Bld: 91 mg/dL (ref 70–99)
Potassium: 3.9 mmol/L (ref 3.5–5.1)
Sodium: 141 mmol/L (ref 135–145)
Total Bilirubin: 0.6 mg/dL (ref 0.3–1.2)
Total Protein: 7.4 g/dL (ref 6.5–8.1)

## 2020-10-20 ENCOUNTER — Other Ambulatory Visit: Payer: Self-pay | Admitting: Oncology

## 2021-07-17 ENCOUNTER — Ambulatory Visit
Admission: RE | Admit: 2021-07-17 | Discharge: 2021-07-17 | Disposition: A | Discharge status: Home / Self Care | Payer: 59 | Source: Ambulatory Visit | Attending: Hematology and Oncology | Admitting: Hematology and Oncology

## 2021-07-17 DIAGNOSIS — Z170 Estrogen receptor positive status [ER+]: Secondary | ICD-10-CM

## 2021-09-11 ENCOUNTER — Emergency Department (HOSPITAL_BASED_OUTPATIENT_CLINIC_OR_DEPARTMENT_OTHER)
Admission: EM | Admit: 2021-09-11 | Discharge: 2021-09-11 | Disposition: A | Discharge status: Home / Self Care | Payer: 59 | Attending: Emergency Medicine | Admitting: Emergency Medicine

## 2021-09-11 ENCOUNTER — Emergency Department (HOSPITAL_BASED_OUTPATIENT_CLINIC_OR_DEPARTMENT_OTHER): Payer: 59

## 2021-09-11 ENCOUNTER — Encounter (HOSPITAL_BASED_OUTPATIENT_CLINIC_OR_DEPARTMENT_OTHER): Payer: Self-pay

## 2021-09-11 ENCOUNTER — Other Ambulatory Visit: Payer: Self-pay

## 2021-09-11 DIAGNOSIS — M436 Torticollis: Secondary | ICD-10-CM | POA: Insufficient documentation

## 2021-09-11 DIAGNOSIS — Z853 Personal history of malignant neoplasm of breast: Secondary | ICD-10-CM | POA: Diagnosis not present

## 2021-09-11 DIAGNOSIS — R61 Generalized hyperhidrosis: Secondary | ICD-10-CM | POA: Diagnosis not present

## 2021-09-11 DIAGNOSIS — Z79899 Other long term (current) drug therapy: Secondary | ICD-10-CM | POA: Diagnosis not present

## 2021-09-11 DIAGNOSIS — I10 Essential (primary) hypertension: Secondary | ICD-10-CM | POA: Insufficient documentation

## 2021-09-11 DIAGNOSIS — R42 Dizziness and giddiness: Secondary | ICD-10-CM | POA: Diagnosis not present

## 2021-09-11 DIAGNOSIS — M542 Cervicalgia: Secondary | ICD-10-CM | POA: Diagnosis present

## 2021-09-11 LAB — BASIC METABOLIC PANEL
Anion gap: 12 (ref 5–15)
BUN: 19 mg/dL (ref 8–23)
CO2: 28 mmol/L (ref 22–32)
Calcium: 10.2 mg/dL (ref 8.9–10.3)
Chloride: 100 mmol/L (ref 98–111)
Creatinine, Ser: 1.18 mg/dL — ABNORMAL HIGH (ref 0.44–1.00)
GFR, Estimated: 49 mL/min — ABNORMAL LOW (ref >60)
Glucose, Bld: 95 mg/dL (ref 70–99)
Potassium: 4.5 mmol/L (ref 3.5–5.1)
Sodium: 140 mmol/L (ref 135–145)

## 2021-09-11 LAB — CBC
HCT: 40.7 % (ref 36.0–46.0)
Hemoglobin: 13.4 g/dL (ref 12.0–15.0)
MCH: 29.9 pg (ref 26.0–34.0)
MCHC: 32.9 g/dL (ref 30.0–36.0)
MCV: 90.8 fL (ref 80.0–100.0)
Platelets: 281 10*3/uL (ref 150–400)
RBC: 4.48 MIL/uL (ref 3.87–5.11)
RDW: 14.1 % (ref 11.5–15.5)
WBC: 6.9 10*3/uL (ref 4.0–10.5)
nRBC: 0 % (ref 0.0–0.2)

## 2021-09-11 LAB — URINALYSIS, ROUTINE W REFLEX MICROSCOPIC
Bilirubin Urine: NEGATIVE
Glucose, UA: NEGATIVE mg/dL
Hgb urine dipstick: NEGATIVE
Ketones, ur: NEGATIVE mg/dL
Leukocytes,Ua: NEGATIVE
Nitrite: NEGATIVE
Specific Gravity, Urine: 1.023 (ref 1.005–1.030)
pH: 6 (ref 5.0–8.0)

## 2021-09-11 LAB — CBG MONITORING, ED: Glucose-Capillary: 102 mg/dL — ABNORMAL HIGH (ref 70–99)

## 2021-09-11 MED ORDER — CYCLOBENZAPRINE HCL 10 MG PO TABS: 10.0000 mg | ORAL_TABLET | Freq: Two times a day (BID) | ORAL | 0 refills | Status: AC | PRN

## 2021-09-11 MED ORDER — DIAZEPAM 5 MG PO TABS
5.0000 mg | ORAL_TABLET | Freq: Once | ORAL | Status: AC
  Administered 2021-09-11: 5 mg via ORAL
  Filled 2021-09-11: qty 1

## 2021-09-11 MED ORDER — IBUPROFEN 600 MG PO TABS: 600.0000 mg | ORAL_TABLET | Freq: Four times a day (QID) | ORAL | 0 refills | Status: AC | PRN

## 2021-09-11 NOTE — ED Provider Notes (Signed)
[facility] Provider Note   CSN: [id number] Arrival date & time: 09/11/21  1535     History  Chief Complaint  Patient presents with   Neck Pain    Angelica West is a 71 y.o. female.  The history is provided by the patient and medical records. No language interpreter was used.  Neck Pain   71 year old female with significant history of hypertension, anxiety, remote history of breast cancer status post  lumpectomy presenting to the ER with complaint of neck stiffness.  Patient report 5 days ago she went down to the beach for vacation.  She stayed at a hotel that has high pillow.  She normally does not sleep with high pillow and after having a night sleeping on the pillow the next day she report noticing stiffness about her neck.  She states the pain radiates from the back of her neck up towards her head and cause headache.  Symptom has been waxing waning for the past several days without adequate improvement despite taking Tylenol at home.  2 days ago she recall standing up and felt sweaty and dizzy in which she fell down to the ground without any loss of consciousness.  Due to that episode her family was concerned and recommend patient come to the ER for further assessment.  Patient denies having fever, runny nose sneezing or coughing no chest pain or shortness of breath no arm weakness or numbness no confusion.  She denies heart palpitation and she denies urinary symptoms.  Home Medications Prior to Admission medications   Medication Sig Start Date End Date Taking? Authorizing Provider  anastrozole (ARIMIDEX) 1 MG tablet TAKE 1 TABLET BY MOUTH  DAILY 10/22/20   Magrinat, Virgie Dad, MD  Ascorbic Acid (VITAMIN C) 1000 MG tablet Take 1,000 mg by mouth daily.    [provider]  Calcium Carb-Cholecalciferol (CALCIUM 600 + D PO) Take 1 tablet by mouth daily.    [provider]  calcium carbonate (OS-CAL - DOSED IN MG OF ELEMENTAL CALCIUM) 1250  (500 Ca) MG tablet Take 1 tablet by mouth daily.    [provider]  Cholecalciferol (VITAMIN D) 50 MCG (2000 UT) tablet Take 4,000 Units by mouth daily.    [provider]  Coenzyme Q10 300 MG CAPS Take 300 mg by mouth daily.    [provider]  ferrous sulfate (FERROUSUL) 325 (65 FE) MG tablet Take 1 tablet (325 mg total) by mouth 3 (three) times daily with meals. 04/13/18   Danae Orleans, PA-C  GARLIC PO Take 0,867 mg by mouth daily.     [provider]  Glucosamine HCl-MSM (GLUCOSAMINE-MSM PO) Take 2 tablets by mouth daily.    [provider]  hydrochlorothiazide (HYDRODIURIL) 25 MG tablet Take 25 mg by mouth daily.    [provider]  lisinopril (PRINIVIL,ZESTRIL) 20 MG tablet Take 20 mg by mouth daily.    [provider]  Multiple Vitamin (MULTIVITAMIN WITH MINERALS) TABS tablet Take 1-2 tablets by mouth daily.    [provider]  Omega-3 Fatty Acids (FISH OIL) 1000 MG CAPS Take 1 capsule by mouth every other day.    [provider]  TURMERIC PO Take 1 tablet by mouth daily.    [provider]  zinc gluconate 50 MG tablet Take 50 mg by mouth daily.    [provider]      Allergies    Patient has no known allergies.    Review of Systems  Review of Systems  Musculoskeletal:  Positive for neck pain.  All other systems reviewed and are negative.   Physical Exam Updated Vital Signs BP 128/83 (BP Location: Right Arm)   Pulse 78   Temp 98.5 F (36.9 C)   Resp 18   Ht 5' 5.5" (1.664 m)   Wt 65.3 kg   LMP 03/10/1997   SpO2 99%   BMI 23.59 kg/m  Physical Exam Vitals and nursing note reviewed.  Constitutional:      General: She is not in acute distress.    Appearance: She is well-developed.  HENT:     Head: Atraumatic.  Eyes:     Extraocular Movements: Extraocular movements intact.     Conjunctiva/sclera: Conjunctivae normal.     Pupils: Pupils are equal, round, and reactive  to light.  Cardiovascular:     Rate and Rhythm: Normal rate and regular rhythm.  Pulmonary:     Effort: Pulmonary effort is normal.  Abdominal:     Palpations: Abdomen is soft.     Tenderness: There is no abdominal tenderness.  Musculoskeletal:     Cervical back: Rigidity present.  Skin:    Findings: No rash.  Neurological:     Mental Status: She is alert.     Comments: Neurologic exam:  Speech clear, pupils equal round reactive to light, extraocular movements intact  Normal peripheral visual fields Cranial nerves III through XII normal including no facial droop Follows commands, moves all extremities x4, normal strength to bilateral upper and lower extremities at all major muscle groups including grip Sensation normal to light touch  Coordination intact, no limb ataxia, finger-nose-finger normal Rapid alternating movements normal No pronator drift Gait normal   Psychiatric:        Mood and Affect: Mood normal.     ED Results / Procedures / Treatments   Labs (all labs ordered are listed, but only abnormal results are displayed) Labs Reviewed  BASIC METABOLIC PANEL - Abnormal; Notable for the following components:      Result Value   Creatinine, Ser 1.18 (*)    GFR, Estimated 49 (*)    All other components within normal limits  URINALYSIS, ROUTINE W REFLEX MICROSCOPIC - Abnormal; Notable for the following components:   Protein, ur TRACE (*)    All other components within normal limits  CBG MONITORING, ED - Abnormal; Notable for the following components:   Glucose-Capillary 102 (*)    All other components within normal limits  CBC    EKG None  Date: 09/11/2021  Rate: 78  Rhythm: normal sinus rhythm  QRS Axis: normal  Intervals: normal  ST/T Wave abnormalities: normal  Conduction Disutrbances: none  Narrative Interpretation:   Old EKG Reviewed: No significant changes noted    Radiology CT Head Wo Contrast  Result Date: 09/11/2021 CLINICAL DATA:  Recent  fall with headaches and neck pain, initial encounter EXAM: CT HEAD WITHOUT CONTRAST CT CERVICAL SPINE WITHOUT CONTRAST TECHNIQUE: Multidetector CT imaging of the head and cervical spine was performed following the standard protocol without intravenous contrast. Multiplanar CT image reconstructions of the cervical spine were also generated. RADIATION DOSE REDUCTION: This exam was performed according to the departmental dose-optimization program which includes automated exposure control, adjustment of the mA and/or kV according to patient size and/or use of iterative reconstruction technique. COMPARISON:  None Available. FINDINGS: CT HEAD FINDINGS Brain: No evidence of acute infarction, hemorrhage, hydrocephalus, extra-axial collection or mass lesion/mass effect. Vascular: No hyperdense vessel or unexpected calcification. Skull:  Normal. Negative for fracture or focal lesion. Sinuses/Orbits: No acute finding. Other: None. CT CERVICAL SPINE FINDINGS Alignment: Normal. Skull base and vertebrae: 7 cervical segments are well visualized. Vertebral body height is maintained. Multilevel facet hypertrophic changes and mild osteophytic changes are seen. No acute fracture or acute facet abnormality is noted. Soft tissues and spinal canal: Surrounding soft tissue structures are within normal limits. Upper chest: Visualized lung apices are unremarkable. Other: None IMPRESSION: CT of the head: No acute intracranial abnormality noted. CT of the cervical spine: Multilevel degenerative change without acute abnormality. Electronically Signed   By: Inez Catalina M.D.   On: 09/11/2021 19:38   CT Cervical Spine Wo Contrast  Result Date: 09/11/2021 CLINICAL DATA:  Recent fall with headaches and neck pain, initial encounter EXAM: CT HEAD WITHOUT CONTRAST CT CERVICAL SPINE WITHOUT CONTRAST TECHNIQUE: Multidetector CT imaging of the head and cervical spine was performed following the standard protocol without intravenous contrast.  Multiplanar CT image reconstructions of the cervical spine were also generated. RADIATION DOSE REDUCTION: This exam was performed according to the departmental dose-optimization program which includes automated exposure control, adjustment of the mA and/or kV according to patient size and/or use of iterative reconstruction technique. COMPARISON:  None Available. FINDINGS: CT HEAD FINDINGS Brain: No evidence of acute infarction, hemorrhage, hydrocephalus, extra-axial collection or mass lesion/mass effect. Vascular: No hyperdense vessel or unexpected calcification. Skull: Normal. Negative for fracture or focal lesion. Sinuses/Orbits: No acute finding. Other: None. CT CERVICAL SPINE FINDINGS Alignment: Normal. Skull base and vertebrae: 7 cervical segments are well visualized. Vertebral body height is maintained. Multilevel facet hypertrophic changes and mild osteophytic changes are seen. No acute fracture or acute facet abnormality is noted. Soft tissues and spinal canal: Surrounding soft tissue structures are within normal limits. Upper chest: Visualized lung apices are unremarkable. Other: None IMPRESSION: CT of the head: No acute intracranial abnormality noted. CT of the cervical spine: Multilevel degenerative change without acute abnormality. Electronically Signed   By: Inez Catalina M.D.   On: 09/11/2021 19:38    Procedures Procedures    Medications Ordered in ED Medications  diazepam (VALIUM) tablet 5 mg (5 mg Oral Given 09/11/21 1948)    ED Course/ Medical Decision Making/ A&P                           Medical Decision Making Amount and/or Complexity of Data Reviewed Labs: ordered. Radiology: ordered.  Risk Prescription drug management.   BP 135/83   Pulse 71   Temp 98.5 F (36.9 C)   Resp 19   Ht 5' 5.5" (1.664 m)   Wt 65.3 kg   LMP 03/10/1997   SpO2 99%   BMI 23.59 kg/m   6:56 PM This is a 71 year old female presenting with complaints of neck stiffness and headache ongoing for  the past 4 to 5 days after she slept on a high pillow when she travels on vacation.  She also report having an episode of lightheadedness and diaphoretic 2 days ago when she stood up quickly and fell down to the ground.  On exam, she does have decreased neck range of motion however she is without fever or focal neurodeficit concerning for meningitis or stroke.  She is overall well-appearing, labs obtained independently viewed interpreted by me.  Mild AKI with a creatinine of 1.18.  Urinalysis without signs of urine tract infection, electrolyte panels are reassuring.  I suspect her neck stiffness is likely torticollis from her  pillow however given her age and her presentation as well as 1 episode of lightheadedness/dizziness, will obtain head and cervical spine CT scan for further assessment.  Valium 5 mg p.o. given as muscle relaxant.  8:43 PM CT scan of the head and cervical spine was obtained, independently viewed interpreted by me and I agree with radiologist interpretation.  Fortunately CT scan of the head without any acute intracranial abnormalities and CT is scan of the cervical spine shows multilevel degenerative change without any acute abnormalities.  This is reassuring.  After receiving Valium, patient report much improvement of his symptoms.  Care discussed with Dr. Laverta Baltimore who has seen and evaluated patient and agree with plan.  At this time I suspect patient is having torticollis and will discharge home with anti-inflammatory medication and muscle relaxant to use as needed.  Return precaution given.  Patient otherwise stable for discharge.  Patient made aware that muscle relaxant can be drowsy.  I have considered admission but in the setting of normal CT scan and improvement of symptoms I felt patient stable to be discharged home.  This patient presents to the ED for concern of neck stiffness, this involves an extensive number of treatment options, and is a complaint that carries with it a high  risk of complications and morbidity.  The differential diagnosis includes torticollis, MSK pain, meningitis, radiculitis, cspine fx  Co morbidities that complicate the patient evaluation HTN  Hx of breast CA Additional history obtained:  Additional history obtained from husband External records from outside source obtained and reviewed including notes from oncology and [facility]  Lab Tests:  I Ordered, and personally interpreted labs.  The pertinent results include:  as above  Imaging Studies ordered:  I ordered imaging studies including head/cspine CT I independently visualized and interpreted imaging which showed no acute changes I agree with the radiologist interpretation  Cardiac Monitoring:  The patient was maintained on a cardiac monitor.  I personally viewed and interpreted the cardiac monitored which showed an underlying rhythm of: NSR  Medicines ordered and prescription drug management:  I ordered medication including valium  for stiffness Reevaluation of the patient after these medicines showed that the patient improved I have reviewed the patients home medicines and have made adjustments as needed  Test Considered: as above  Critical Interventions: as above  Consultations Obtained:  I requested consultation with the attending Dr. Laverta Baltimore,  and discussed lab and imaging findings as well as pertinent plan - they recommend: outpt f/u  Problem List / ED Course: torticollis  Reevaluation:  After the interventions noted above, I reevaluated the patient and found that they have :improved  Social Determinants of Health: none  Dispostion:  After consideration of the diagnostic results and the patients response to treatment, I feel that the patent would benefit from discharge home and outpt f/u.         Final Clinical Impression(s) / ED Diagnoses Final diagnoses:  Torticollis    Rx / DC Orders ED Discharge Orders          Ordered    ibuprofen (ADVIL) 600  MG tablet  Every 6 hours PRN        09/11/21 2040    cyclobenzaprine (FLEXERIL) 10 MG tablet  2 times daily PRN        09/11/21 2040              Domenic Moras, PA-C 09/11/21 2047    Long, Wonda Olds, MD 09/16/21 [phone]

## 2021-09-11 NOTE — ED Triage Notes (Signed)
Patient here POV from Home.  Endorses having Neck Stiffness after Sleeping in [place] approximately 5 Days PTA.  States Tuesday AM she fell at 0400 yesterday AM. No LOC but Patient was Disoriented. No Anticoagulants.   Patient is not completely sure as to why she fell. Today the Patient called her PCP which recommended ED Evaluation.  Uncomfortable during Triage. A&Ox4. GCS 15. Ambulatory.

## 2021-09-11 NOTE — Discharge Instructions (Signed)
You have been evaluated for your neck stiffness.  This is likely due to muscle tightness causing your discomfort.  You may take anti-inflammatory medication and muscle relaxant as needed for your symptoms.  Please be aware that the medication can cause drowsiness.  Follow-up with your doctor for further care.  Return if you have any concern.  Fortunately CT scan of your head and neck did not show any concerning finding.

## 2021-09-11 NOTE — ED Notes (Signed)
RN provided AVS using Teachback Method. Patient verbalizes understanding of Discharge Instructions. Opportunity for Questioning and Answers were provided by RN. Patient Discharged from ED ambulatory to Home with Family. ? ?

## 2021-09-11 NOTE — ED Notes (Signed)
Patient transported to CT 

## 2021-09-23 ENCOUNTER — Other Ambulatory Visit: Payer: Self-pay

## 2021-09-25 ENCOUNTER — Other Ambulatory Visit: Payer: Self-pay | Admitting: *Deleted

## 2021-09-25 NOTE — Telephone Encounter (Signed)
Received refill request for anastrazole- per chart review noted pt is to complete 5 years per next visit scheduled 10/10/2021.  This RN called pt to inquire current amount of medication - which she states she has a whole bottle.  Discussed that she will be completing therapy soon and medication will not be refilled at this time.  Pt in agreement to plan- verified F/U appt.

## 2021-10-04 NOTE — Progress Notes (Signed)
Patient Care Team: Pcp, No as PCP - General Erroll Luna, MD as Consulting Physician (General Surgery) Magrinat, Virgie Dad, MD (Inactive) as Consulting Physician (Oncology) Eppie Gibson, MD as Attending Physician (Radiation Oncology) Paralee Cancel, MD as Consulting Physician (Orthopedic Surgery)  DIAGNOSIS: No diagnosis found.  SUMMARY OF ONCOLOGIC HISTORY: Oncology History   No history exists.    CHIEF COMPLIANT:   INTERVAL HISTORY: Angelica West is a   ALLERGIES:  has No Known Allergies.  MEDICATIONS:  Current Outpatient Medications  Medication Sig Dispense Refill   anastrozole (ARIMIDEX) 1 MG tablet TAKE 1 TABLET BY MOUTH  DAILY 90 tablet 3   Ascorbic Acid (VITAMIN C) 1000 MG tablet Take 1,000 mg by mouth daily.     Calcium Carb-Cholecalciferol (CALCIUM 600 + D PO) Take 1 tablet by mouth daily.     calcium carbonate (OS-CAL - DOSED IN MG OF ELEMENTAL CALCIUM) 1250 (500 Ca) MG tablet Take 1 tablet by mouth daily.     Cholecalciferol (VITAMIN D) 50 MCG (2000 UT) tablet Take 4,000 Units by mouth daily.     Coenzyme Q10 300 MG CAPS Take 300 mg by mouth daily.     cyclobenzaprine (FLEXERIL) 10 MG tablet Take 1 tablet (10 mg total) by mouth 2 (two) times daily as needed for muscle spasms. 20 tablet 0   ferrous sulfate (FERROUSUL) 325 (65 FE) MG tablet Take 1 tablet (325 mg total) by mouth 3 (three) times daily with meals.  3   GARLIC PO Take 7,948 mg by mouth daily.      Glucosamine HCl-MSM (GLUCOSAMINE-MSM PO) Take 2 tablets by mouth daily.     hydrochlorothiazide (HYDRODIURIL) 25 MG tablet Take 25 mg by mouth daily.     ibuprofen (ADVIL) 600 MG tablet Take 1 tablet (600 mg total) by mouth every 6 (six) hours as needed. 30 tablet 0   lisinopril (PRINIVIL,ZESTRIL) 20 MG tablet Take 20 mg by mouth daily.     Multiple Vitamin (MULTIVITAMIN WITH MINERALS) TABS tablet Take 1-2 tablets by mouth daily.     Omega-3 Fatty Acids (FISH OIL) 1000 MG CAPS Take 1 capsule by mouth  every other day.     TURMERIC PO Take 1 tablet by mouth daily.     zinc gluconate 50 MG tablet Take 50 mg by mouth daily.     No current facility-administered medications for this visit.    PHYSICAL EXAMINATION: ECOG PERFORMANCE STATUS: {CHL ONC ECOG [phone]}  There were no vitals filed for this visit. There were no vitals filed for this visit.  BREAST:*** No palpable masses or nodules in either right or left breasts. No palpable axillary supraclavicular or infraclavicular adenopathy no breast tenderness or nipple discharge. (exam performed in the presence of a chaperone)  LABORATORY DATA:  I have reviewed the data as listed    Latest Ref Rng & Units 09/11/2021    4:18 PM 10/10/2020    8:56 AM 10/10/2019    2:20 PM  CMP  Glucose 70 - 99 mg/dL 95  91  103   BUN 8 - 23 mg/dL '19  18  17   '$ Creatinine 0.44 - 1.00 mg/dL 1.18  1.09  1.27   Sodium 135 - 145 mmol/L 140  141  140   Potassium 3.5 - 5.1 mmol/L 4.5  3.9  4.2   Chloride 98 - 111 mmol/L 100  102  103   CO2 22 - 32 mmol/L '28  30  28   '$ Calcium 8.9 -  10.3 mg/dL 10.2  9.8  10.3   Total Protein 6.5 - 8.1 g/dL  7.4  7.5   Total Bilirubin 0.3 - 1.2 mg/dL  0.6  0.6   Alkaline Phos 38 - 126 U/L  86  83   AST 15 - 41 U/L  27  23   ALT 0 - 44 U/L  26  23     Lab Results  Component Value Date   WBC 6.9 09/11/2021   HGB 13.4 09/11/2021   HCT 40.7 09/11/2021   MCV 90.8 09/11/2021   PLT 281 09/11/2021   NEUTROABS 2.1 10/10/2020    ASSESSMENT & PLAN:  No problem-specific Assessment & Plan notes found for this encounter.    No orders of the defined types were placed in this encounter.  The patient has a good understanding of the overall plan. she agrees with it. she will call with any problems that may develop before the next visit here. Total time spent: 30 mins including face to face time and time spent for planning, charting and co-ordination of care   Suzzette Righter, [place] 10/04/21    I Gardiner Coins am scribing  for Dr. Lindi Adie  ***

## 2021-10-09 ENCOUNTER — Other Ambulatory Visit: Payer: Self-pay | Admitting: *Deleted

## 2021-10-10 ENCOUNTER — Inpatient Hospital Stay: Payer: 59 | Attending: Hematology and Oncology | Admitting: Hematology and Oncology

## 2021-10-10 ENCOUNTER — Other Ambulatory Visit: Payer: Self-pay

## 2021-10-10 DIAGNOSIS — Z923 Personal history of irradiation: Secondary | ICD-10-CM | POA: Diagnosis not present

## 2021-10-10 DIAGNOSIS — Z170 Estrogen receptor positive status [ER+]: Secondary | ICD-10-CM | POA: Diagnosis not present

## 2021-10-10 DIAGNOSIS — Z86000 Personal history of in-situ neoplasm of breast: Secondary | ICD-10-CM | POA: Insufficient documentation

## 2021-10-10 DIAGNOSIS — C50412 Malignant neoplasm of upper-outer quadrant of left female breast: Secondary | ICD-10-CM | POA: Diagnosis not present

## 2021-10-10 NOTE — Assessment & Plan Note (Signed)
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  Breast cancer surveillance: 1.  Breast exam 10/10/2021: Benign 2. mammogram 07/18/2021: Benign breast density category D CT cervical spine: Degenerative changes  Return to clinic on an as-needed basis.

## 2022-06-18 ENCOUNTER — Other Ambulatory Visit: Payer: Self-pay | Admitting: Family Medicine

## 2022-06-18 DIAGNOSIS — Z1231 Encounter for screening mammogram for malignant neoplasm of breast: Secondary | ICD-10-CM

## 2022-07-31 ENCOUNTER — Ambulatory Visit: Payer: 59

## 2022-08-19 ENCOUNTER — Ambulatory Visit: Payer: 59

## 2022-08-21 ENCOUNTER — Other Ambulatory Visit: Payer: Self-pay | Admitting: Family

## 2022-08-21 ENCOUNTER — Ambulatory Visit: Payer: 59

## 2022-08-21 DIAGNOSIS — N63 Unspecified lump in unspecified breast: Secondary | ICD-10-CM

## 2022-08-28 ENCOUNTER — Ambulatory Visit
Admission: RE | Admit: 2022-08-28 | Discharge: 2022-08-28 | Disposition: A | Discharge status: Home / Self Care | Payer: 59 | Source: Ambulatory Visit | Attending: Family | Admitting: Family

## 2022-08-28 DIAGNOSIS — N63 Unspecified lump in unspecified breast: Secondary | ICD-10-CM

## 2023-06-23 ENCOUNTER — Other Ambulatory Visit: Payer: Self-pay

## 2023-06-23 ENCOUNTER — Encounter: Payer: Self-pay | Admitting: Physical Therapy

## 2023-06-23 ENCOUNTER — Ambulatory Visit: Attending: Nurse Practitioner | Admitting: Physical Therapy

## 2023-06-23 DIAGNOSIS — R279 Unspecified lack of coordination: Secondary | ICD-10-CM | POA: Insufficient documentation

## 2023-06-23 DIAGNOSIS — R293 Abnormal posture: Secondary | ICD-10-CM | POA: Diagnosis present

## 2023-06-23 DIAGNOSIS — M6281 Muscle weakness (generalized): Secondary | ICD-10-CM | POA: Diagnosis present

## 2023-06-23 NOTE — Patient Instructions (Signed)
 When you sit down to use the bathroom, make sure to do the following: Sit all the way down, feet flat on the floor Make sure to let the urine flow out NATURALLY, do not push down to push urine out  When you think you are done urinating, take a deep belly breath to see if you can urinate any more   Urge Incontinence  Ideal urination frequency is every 2-4 wakeful hours, which equates to 5-8 times within a 24-hour period.   Urge incontinence is leakage that occurs when the bladder muscle contracts, creating a sudden need to go before getting to the bathroom.   Going too often when your bladder isn't actually full can disrupt the body's automatic signals to store and hold urine longer, which will increase urgency/frequency.  In this case, the bladder "is running the show" and strategies can be learned to retrain this pattern.   One should be able to control the first urge to urinate, at around .  The bladder can hold up to a "grande latte," or . To help you gain control, practice the Urge Drill below when urgency strikes.  This drill will help retrain your bladder signals and allow you to store and hold urine longer.  The overall goal is to stretch out your time between voids to reach a more manageable voiding schedule.    Practice your "quick flicks" often throughout the day (each waking hour) even when you don't need feel the urge to go.  This will help strengthen your pelvic floor muscles, making them more effective in controlling leakage.  Urge Drill  When you feel an urge to go, follow these steps to regain control: Stop what you are doing and be still Take one deep breath, directing your air into your abdomen Think an affirming thought, such as "I've got this." Do 5 quick flicks of your pelvic floor Walk with control to the bathroom to void, or delay voiding

## 2023-06-23 NOTE — Therapy (Signed)
 OUTPATIENT PHYSICAL THERAPY FEMALE PELVIC EVALUATION   Patient Name: Angelica West MRN: [id number] DOB:[birth year], 73 y.o., female Today's Date: 06/23/2023  END OF SESSION:  PT End of Session - 06/23/23 1322     Visit Number 1    Number of Visits 10    Authorization Type [employer]    PT Start Time 1116    PT Stop Time 1145    PT Time Calculation (min) 29 min    Activity Tolerance Patient tolerated treatment well    Behavior During Therapy WFL for tasks assessed/performed             Past Medical History:  Diagnosis Date   Anxiety    History of radiation therapy 08/19/16- 09/16/16   Left Breast 40/05 Gy in 15 fractions, Left Breast Boost 10 Gy in 5 fractions   Hypertension    Personal history of radiation therapy    Pre-diabetes    Past Surgical History:  Procedure Laterality Date   BILATERAL SALPINGONEOSTOMY, LYSIS OF ADHESIONS, R OV CYSTECTOMY     per patient : questionable    BREAST LUMPECTOMY Left 07/16/2016   RADIOACTIVE SEED GUIDED PARTIAL MASTECTOMY WITH AXILLARY SENTINEL LYMPH NODE BIOPSY Left 07/16/2016   Procedure: RADIOACTIVE SEED GUIDED LEFT PARTIAL MASTECTOMY WITH AXILLARY SENTINEL LYMPH NODE BIOPSY;  Surgeon: Sim Dryer, MD;  Location: [facility];  Service: General;  Laterality: Left;   TOTAL HIP ARTHROPLASTY Right 04/13/2018   Procedure: TOTAL HIP ARTHROPLASTY ANTERIOR APPROACH;  Surgeon: Claiborne Crew, MD;  Location: [facility];  Service: Orthopedics;  Laterality: Right;   Patient Active Problem List   Diagnosis Date Noted   S/P right THA, AA 04/13/2018   Malignant neoplasm of upper-outer quadrant of left breast in female, estrogen receptor positive (HCC) 06/17/2016   Hypertension 02/20/2012    PCP: Dr. Joelyn Music PROVIDER: Constantino Demark, FNP   REFERRING DIAG: N39.41 (ICD-10-CM) - Urge incontinence  THERAPY DIAG:  Muscle weakness (generalized)  Unspecified lack of coordination  Abnormal posture  Rationale  for Evaluation and Treatment: Rehabilitation  ONSET DATE: early 2024  SUBJECTIVE:                                                                                                                                                                                           SUBJECTIVE STATEMENT: Eval: Patient reports to PT with urge urinary incontinence that started sometime last year. No specific activities cause leakage, she will just leak when her bladder is full and she is trying to make it to the bathroom on time. Sometimes she leaks a lot, other times it is  a small amount of leakage. Fluid intake: consumes a large amount of water, drinks a coffee in the morning   PAIN:  Are you having pain? No NPRS scale: 0/10  PRECAUTIONS: None  RED FLAGS: None   WEIGHT BEARING RESTRICTIONS: No  FALLS:  Has patient fallen in last 6 months? Yes. Number of falls 1 - had a fall in march, she was walking downhill and was on the phone and tripped on the curb   OCCUPATION: retired   ACTIVITY LEVEL : does a bootcamp class outside in the warmer months (2x/wk, 45 minutes), walks 5 miles every day, goes to [employer] in the winter   PLOF: Independent  PATIENT GOALS: decrease urinary leakage   PERTINENT HISTORY:  Hx: anxiety, hypertension, pre-diabetes, surgery in right hip  Sexual abuse: No  BOWEL MOVEMENT: Pain with bowel movement: No Type of bowel movement:Type (Bristol Stool Scale) 4, Frequency 1x/day, Strain no, and Splinting no Fully empty rectum: Yes:   Leakage: No Pads: No Fiber supplement/laxative No  URINATION: Pain with urination: No Fully empty bladder: Nosometimes she has to turn on the water to see if she can go some more  Stream: Weak Urgency: Yes  Frequency: goes a lot - more than she used to; typically gets up 1-2x/night Leakage: Urge to void Pads: No  INTERCOURSE:  Ability to have vaginal penetration No  Pain with intercourse: none DrynessNo - used to have a problem  with this, not anymore  Climax: yes Marinoff Scale: 0/3  PREGNANCY: Vaginal deliveries 0  PROLAPSE: None   OBJECTIVE:  Note: Objective measures were completed at Evaluation unless otherwise noted.  PATIENT SURVEYS: PFIQ-7: 14  COGNITION: Overall cognitive status: Within functional limits for tasks assessed     SENSATION: Light touch: Appears intact  LUMBAR SPECIAL TESTS:  Single leg stance test: Positive  FUNCTIONAL TESTS:  Squat test:   GAIT: Assistive device utilized: None Comments: mild trendelenburg gait pattern with ambulation   POSTURE: rounded shoulders, forward head, increased thoracic kyphosis, and flexed trunk    LUMBARAROM/PROM: within normal limits   A/PROM A/PROM  eval  Flexion   Extension   Right lateral flexion   Left lateral flexion   Right rotation   Left rotation    (Blank rows = not tested)  LOWER EXTREMITY ROM: within functional limits   Active ROM Right eval Left eval  Hip flexion    Hip extension    Hip abduction    Hip adduction    Hip internal rotation    Hip external rotation    Knee flexion    Knee extension    Ankle dorsiflexion    Ankle plantarflexion    Ankle inversion    Ankle eversion     (Blank rows = not tested)  LOWER EXTREMITY MMT: 4/5 bilateral knees and hips grossly   MMT Right eval Left eval  Hip flexion    Hip extension    Hip abduction    Hip adduction    Hip internal rotation    Hip external rotation    Knee flexion    Knee extension    Ankle dorsiflexion    Ankle plantarflexion    Ankle inversion    Ankle eversion     (Blank rows = not tested) PALPATION:   General: no tenderness to palpation in lumbar musculature or gluteals bilaterally   Pelvic Alignment: within normal limits   Abdominal: upper chest breathing at rest, abdominal bracing present, decreased rib excursion with inhalation  External Perineal Exam: deferred due time, assess at first follow up visit                              Internal Pelvic Floor: deferred due time, assess at first follow up visit  Patient confirms identification and approves PT to assess internal pelvic floor and treatment Yes No emotional/communication barriers or cognitive limitation. Patient is motivated to learn. Patient understands and agrees with treatment goals and plan. PT explains patient will be examined in standing, sitting, and lying down to see how their muscles and joints work. When they are ready, they will be asked to remove their underwear so PT can examine their perineum. The patient is also given the option of providing their own chaperone as one is not provided in our facility. The patient also has the right and is explained the right to defer or refuse any part of the evaluation or treatment including the internal exam. With the patient's consent, PT will use one gloved finger to gently assess the muscles of the pelvic floor, seeing how well it contracts and relaxes and if there is muscle symmetry. After, the patient will get dressed and PT and patient will discuss exam findings and plan of care. PT and patient discuss plan of care, schedule, attendance policy and HEP activities.  PELVIC MMT: deferred due time, assess at first follow up visit   MMT eval  Vaginal   Internal Anal Sphincter   External Anal Sphincter   Puborectalis   Diastasis Recti   (Blank rows = not tested)        TONE: deferred due time, assess at first follow up visit  PROLAPSE: deferred due time, assess at first follow up visit  TODAY'S TREATMENT:                                                                                                                              DATE:   EVAL 06/23/23: Examination completed, findings reviewed, pt educated on POC, HEP, and self care. Pt motivated to participate in PT and agreeable to attempt recommendations.   Neuro re-ed: Urge suppression technique for urge urinary incontinence: 1 deep  diaphragmatic breathing, 5 quick flicks, slowly walking to the bathroom  Self care:  Relative anatomy and the connection between the pelvic floor and diaphragm, intraabdominal pressure management and how this affects the pelvic floor musculature, fully emptying the bladder and toileting mechanics   PATIENT EDUCATION:  Education details: Relative anatomy and the connection between the pelvic floor and diaphragm, intraabdominal pressure management and how this affects the pelvic floor musculature, fully emptying the bladder and toileting mechanics  Person educated: Patient Education method: Explanation, Demonstration, Tactile cues, Verbal cues, and Handouts Education comprehension: verbalized understanding, returned demonstration, verbal cues required, tactile cues required, and needs further education  HOME EXERCISE PROGRAM: Establish at follow up visit.   ASSESSMENT:  CLINICAL IMPRESSION: Patient  is a 73 y.o. female  who was seen today for physical therapy evaluation and treatment for urge urinary incontinence. Patient reports that she will only leak when her urgency feels high and she is trying to make it to the bathroom in time. She demonstrates generalized lumbopelvic weakness/stiffness and a lack of diaphragmatic involvement with pelvic floor involvement. Patient educated on fully emptying and techniques to ensure she has emptied as much as possible before standing up from voiding. Urge drill introduced to help manage patient's urinary urgency when walking to the bathroom. Patient would benefit from further skilled intervention to address lumbopelvic weakness and lack of coordination between diaphragm and pelvic floor.    OBJECTIVE IMPAIRMENTS: decreased coordination, decreased endurance, decreased mobility, decreased ROM, and decreased strength.   ACTIVITY LIMITATIONS: continence  PARTICIPATION LIMITATIONS:  N/A  PERSONAL FACTORS: Age, Past/current experiences, and Time since onset of  injury/illness/exacerbation are also affecting patient's functional outcome.   REHAB POTENTIAL: Good  CLINICAL DECISION MAKING: Stable/uncomplicated  EVALUATION COMPLEXITY: Low   GOALS: Goals reviewed with patient? Yes  SHORT TERM GOALS: Target date: 07/21/2023  Pt will be independent with HEP.  Baseline: Goal status: INITIAL  2.  Pt will be independent with the knack, urge suppression technique, and double voiding in order to improve bladder habits and decrease urinary incontinence.   Baseline:  Goal status: INITIAL  3.  Pt will be independent with diaphragmatic breathing and down training activities in order to improve pelvic floor relaxation.  Baseline:  Goal status: INITIAL  LONG TERM GOALS: Target date: 12/23/2023  Pt will be independent with advanced HEP.  Baseline:  Goal status: INITIAL  2.  Pt to demonstrate improved coordination of pelvic floor and breathing mechanics with 10# squat with appropriate synergistic patterns to decrease leakage at least 75% of the time.    Baseline:  Goal status: INITIAL  3.  Pt to demonstrate at least 4/5 pelvic floor strength for improved pelvic stability and decreased strain at pelvic floor/ decrease leakage to improve quality of life and decrease chances of falling on the way to the restroom due to urgency.  Baseline:  Goal status: INITIAL  PLAN:  PT FREQUENCY: 1-2x/week  PT DURATION: 12 weeks  PLANNED INTERVENTIONS: 97110-Therapeutic exercises, 97530- Therapeutic activity, 97112- Neuromuscular re-education, 97535- Self Care, 19147- Manual therapy, Patient/Family education, Taping, Dry Needling, Joint mobilization, Spinal mobilization, Scar mobilization, Cryotherapy, and Moist heat  PLAN FOR NEXT SESSION: internal vaginal examination to assess pelvic floor range of motion, introduce movement with gravitational load to train pelvic floor during bouts of increased urgency   Marni Sins, PT 06/23/2023, 1:31 PM

## 2023-08-17 ENCOUNTER — Ambulatory Visit: Attending: Nurse Practitioner | Admitting: Physical Therapy

## 2023-08-17 ENCOUNTER — Encounter: Payer: Self-pay | Admitting: Physical Therapy

## 2023-08-17 DIAGNOSIS — R279 Unspecified lack of coordination: Secondary | ICD-10-CM | POA: Diagnosis present

## 2023-08-17 DIAGNOSIS — R293 Abnormal posture: Secondary | ICD-10-CM | POA: Insufficient documentation

## 2023-08-17 DIAGNOSIS — M6281 Muscle weakness (generalized): Secondary | ICD-10-CM | POA: Diagnosis present

## 2023-08-17 NOTE — Therapy (Signed)
 OUTPATIENT PHYSICAL THERAPY FEMALE PELVIC TREATMENT   Patient Name: Angelica West MRN: [id number] DOB:[birth year], 73 y.o., female Today's Date: 08/17/2023  END OF SESSION:  PT End of Session - 08/17/23 1429     Visit Number 2    Authorization Type [employer]    PT Start Time 1409    PT Stop Time 1447    PT Time Calculation (min) 38 min    Activity Tolerance Patient tolerated treatment well    Behavior During Therapy WFL for tasks assessed/performed              Past Medical History:  Diagnosis Date   Anxiety    History of radiation therapy 08/19/16- 09/16/16   Left Breast 40/05 Gy in 15 fractions, Left Breast Boost 10 Gy in 5 fractions   Hypertension    Personal history of radiation therapy    Pre-diabetes    Past Surgical History:  Procedure Laterality Date   BILATERAL SALPINGONEOSTOMY, LYSIS OF ADHESIONS, R OV CYSTECTOMY     per patient : questionable    BREAST LUMPECTOMY Left 07/16/2016   RADIOACTIVE SEED GUIDED PARTIAL MASTECTOMY WITH AXILLARY SENTINEL LYMPH NODE BIOPSY Left 07/16/2016   Procedure: RADIOACTIVE SEED GUIDED LEFT PARTIAL MASTECTOMY WITH AXILLARY SENTINEL LYMPH NODE BIOPSY;  Surgeon: Sim Dryer, MD;  Location: [facility];  Service: General;  Laterality: Left;   TOTAL HIP ARTHROPLASTY Right 04/13/2018   Procedure: TOTAL HIP ARTHROPLASTY ANTERIOR APPROACH;  Surgeon: Claiborne Crew, MD;  Location: [facility];  Service: Orthopedics;  Laterality: Right;   Patient Active Problem List   Diagnosis Date Noted   S/P right THA, AA 04/13/2018   Malignant neoplasm of upper-outer quadrant of left breast in female, estrogen receptor positive (HCC) 06/17/2016   Hypertension 02/20/2012    PCP: Dr. Joelyn Music PROVIDER: Constantino Demark, FNP   REFERRING DIAG: N39.41 (ICD-10-CM) - Urge incontinence  THERAPY DIAG:  Muscle weakness (generalized)  Abnormal posture  Unspecified lack of coordination  Rationale for Evaluation and  Treatment: Rehabilitation  ONSET DATE: early 2024  SUBJECTIVE:                                                                                                                                                                                           SUBJECTIVE STATEMENT: Eating  a lot of watermelon before bed time will cause leakage. Pt not sure what started urinary incontinence       Eval: Patient reports to PT with urge urinary incontinence that started sometime last year. No specific activities cause leakage, she will just leak when her bladder is full and she  is trying to make it to the bathroom on time. Sometimes she leaks a lot, other times it is a small amount of leakage. Fluid intake: consumes a large amount of water , drinks some coffee in the morning   PAIN:  Are you having pain? No NPRS scale: 0/10  PRECAUTIONS: None  RED FLAGS: None   WEIGHT BEARING RESTRICTIONS: No  FALLS:  Has patient fallen in last 6 months? Yes. Number of falls 1 - had a fall in march, she was walking downhill and was on the phone and tripped on the curb   OCCUPATION: retired   ACTIVITY LEVEL : does a bootcamp class outside in the warmer months (2x/wk, 45 minutes), walks 5 miles every day, goes to [employer] in the winter   PLOF: Independent  PATIENT GOALS: decrease urinary leakage   PERTINENT HISTORY:  Hx: anxiety, hypertension, pre-diabetes, surgery in right hip  Sexual abuse: No  BOWEL MOVEMENT: no issues Pain with bowel movement: No Type of bowel movement:Type (Bristol Stool Scale) 4, Frequency 1x/day, Strain no, and Splinting no Fully empty rectum: Yes:   Leakage: No Pads: No Fiber supplement/laxative No  URINATION: Pain with urination: No Fully empty bladder: Nosometimes she has to turn on the water  to see if she can go some more - urinary hesitancy- when she goes for a walk, she pees 2x to make sure there is no pee left Stream: Weak Urgency: Yes every 1.5 hrs Frequency:  goes a lot - more than she used to; typically gets up 1-2x/night Leakage: Urge to void Pads: No  INTERCOURSE:  Ability to have vaginal penetration No  Pain with intercourse: none DrynessNo - used to have a problem with this, not anymore  Climax: yes Marinoff Scale: 0/3  PREGNANCY: Vaginal deliveries 0  PROLAPSE: None   OBJECTIVE:  Note: Objective measures were completed at Evaluation unless otherwise noted.  PATIENT SURVEYS: PFIQ-7: 14  COGNITION: Overall cognitive status: Within functional limits for tasks assessed     SENSATION: Light touch: Appears intact  LUMBAR SPECIAL TESTS:  Single leg stance test: Positive  FUNCTIONAL TESTS:  Squat test:   GAIT: Assistive device utilized: None Comments: mild trendelenburg gait pattern with ambulation   POSTURE: rounded shoulders, forward head, increased thoracic kyphosis, and flexed trunk    LUMBARAROM/PROM: within normal limits   A/PROM A/PROM  eval  Flexion   Extension   Right lateral flexion   Left lateral flexion   Right rotation   Left rotation    (Blank rows = not tested)  LOWER EXTREMITY ROM: within functional limits   Active ROM Right eval Left eval  Hip flexion    Hip extension    Hip abduction    Hip adduction    Hip internal rotation    Hip external rotation    Knee flexion    Knee extension    Ankle dorsiflexion    Ankle plantarflexion    Ankle inversion    Ankle eversion     (Blank rows = not tested)  LOWER EXTREMITY MMT: 4/5 bilateral knees and hips grossly   MMT Right eval Left eval  Hip flexion    Hip extension    Hip abduction    Hip adduction    Hip internal rotation    Hip external rotation    Knee flexion    Knee extension    Ankle dorsiflexion    Ankle plantarflexion    Ankle inversion    Ankle eversion     (Blank  rows = not tested) PALPATION:   General: no tenderness to palpation in lumbar musculature or gluteals bilaterally   Pelvic Alignment: within normal  limits   Abdominal: upper chest breathing at rest, abdominal bracing present, decreased rib excursion with inhalation                 External Perineal Exam: deferred due time, assess at first follow up visit                             Internal Pelvic Floor: deferred due time, assess at first follow up visit  Patient confirms identification and approves PT to assess internal pelvic floor and treatment Yes No emotional/communication barriers or cognitive limitation. Patient is motivated to learn. Patient understands and agrees with treatment goals and plan. PT explains patient will be examined in standing, sitting, and lying down to see how their muscles and joints work. When they are ready, they will be asked to remove their underwear so PT can examine their perineum. The patient is also given the option of providing their own chaperone as one is not provided in our facility. The patient also has the right and is explained the right to defer or refuse any part of the evaluation or treatment including the internal exam. With the patient's consent, PT will use one gloved finger to gently assess the muscles of the pelvic floor, seeing how well it contracts and relaxes and if there is muscle symmetry. After, the patient will get dressed and PT and patient will discuss exam findings and plan of care. PT and patient discuss plan of care, schedule, attendance policy and HEP activities.  PELVIC MMT: deferred due time, assess at first follow up visit   MMT eval  Vaginal   Internal Anal Sphincter   External Anal Sphincter   Puborectalis   Diastasis Recti   (Blank rows = not tested)        TONE: deferred due time, assess at first follow up visit  PROLAPSE: deferred due time, assess at first follow up visit  TODAY'S TREATMENT:                                                                                                                              DATE:   08/17/2023 Neuroreed- ball press with  transverse abdominis breath with pelvic floor lift- 15 reps Hip adduction with ball with transverse abdominis breath - 15 reps  There ex- urge suppression strategies education, education or relevant anatomy, education on urge, triggers, just in case peeing   EVAL 06/23/23: Examination completed, findings reviewed, pt educated on POC, HEP, and self care. Pt motivated to participate in PT and agreeable to attempt recommendations.   Neuro re-ed: Urge suppression technique for urge urinary incontinence: 1 deep diaphragmatic breathing, 5 quick flicks, slowly walking to the bathroom  Self care:  Relative anatomy and the connection between the pelvic  floor and diaphragm, intraabdominal pressure management and how this affects the pelvic floor musculature, fully emptying the bladder and toileting mechanics   PATIENT EDUCATION:  Education details: Relative anatomy and the connection between the pelvic floor and diaphragm, intraabdominal pressure management and how this affects the pelvic floor musculature, fully emptying the bladder and toileting mechanics  Person educated: Patient Education method: Explanation, Demonstration, Tactile cues, Verbal cues, and Handouts Education comprehension: verbalized understanding, returned demonstration, verbal cues required, tactile cues required, and needs further education  HOME EXERCISE PROGRAM: [id number]  ASSESSMENT:  CLINICAL IMPRESSION: Patient is a 73 y.o. female  who was seen today for physical therapy evaluation and treatment for urge urinary incontinence. Patient reports that she will only leak when her urgency feels high and she is trying to make it to the bathroom in time. She demonstrates generalized lumbopelvic weakness/stiffness and a lack of diaphragmatic involvement with pelvic floor involvement. Patient educated on fully emptying and techniques to ensure she has emptied as much as possible before standing up from voiding. Urge drill introduced to  help manage patient's urinary urgency when walking to the bathroom. Patient would benefit from further skilled intervention to address lumbopelvic weakness and lack of coordination between diaphragm and pelvic floor.               Pt did fairly well with initial exercise program today, needed VC's and TC's for breath and pelvic floor coordination. Did not agree to internal pelvic floor muscle assessment, discussed what different info it will give us  vs a pelvic exam that looks at her cervix. Pt educated on urge suppression, bladder diary in the future and exercises. She will benefit from cont PT to address deficits.   OBJECTIVE IMPAIRMENTS: decreased coordination, decreased endurance, decreased mobility, decreased ROM, and decreased strength.   ACTIVITY LIMITATIONS: continence  PARTICIPATION LIMITATIONS: N/A  PERSONAL FACTORS: Age, Past/current experiences, and Time since onset of injury/illness/exacerbation are also affecting patient's functional outcome.   REHAB POTENTIAL: Good  CLINICAL DECISION MAKING: Stable/uncomplicated  EVALUATION COMPLEXITY: Low   GOALS: Goals reviewed with patient? Yes  SHORT TERM GOALS: Target date: 07/21/2023  Pt will be independent with HEP.  Baseline: Goal status: INITIAL  2.  Pt will be independent with the knack, urge suppression technique, and double voiding in order to improve bladder habits and decrease urinary incontinence.   Baseline:  Goal status: INITIAL  3.  Pt will be independent with diaphragmatic breathing and down training activities in order to improve pelvic floor relaxation.  Baseline:  Goal status: INITIAL  LONG TERM GOALS: Target date: 12/23/2023  Pt will be independent with advanced HEP.  Baseline:  Goal status: INITIAL  2.  Pt to demonstrate improved coordination of pelvic floor and breathing mechanics with 10# squat with appropriate synergistic patterns to decrease leakage at least 75% of the time.    Baseline:  Goal  status: INITIAL  3.  Pt to demonstrate at least 4/5 pelvic floor strength for improved pelvic stability and decreased strain at pelvic floor/ decrease leakage to improve quality of life and decrease chances of falling on the way to the restroom due to urgency.  Baseline:  Goal status: INITIAL  PLAN:  PT FREQUENCY: 1-2x/week  PT DURATION: 12 weeks  PLANNED INTERVENTIONS: 97110-Therapeutic exercises, 97530- Therapeutic activity, 97112- Neuromuscular re-education, 97535- Self Care, [id number]- Manual therapy, Patient/Family education, Taping, Dry Needling, Joint mobilization, Spinal mobilization, Scar mobilization, Cryotherapy, and Moist heat  PLAN FOR NEXT SESSION: internal vaginal examination to assess pelvic floor  range of motion, introduce movement with gravitational load to train pelvic floor during bouts of increased urgency   Angelica West, PT 08/17/2023, 2:50 PM

## 2023-08-18 ENCOUNTER — Encounter: Payer: Self-pay | Admitting: Family

## 2023-08-18 DIAGNOSIS — Z1231 Encounter for screening mammogram for malignant neoplasm of breast: Secondary | ICD-10-CM

## 2023-08-24 ENCOUNTER — Other Ambulatory Visit: Payer: Self-pay | Admitting: Obstetrics and Gynecology

## 2023-08-24 DIAGNOSIS — N6002 Solitary cyst of left breast: Secondary | ICD-10-CM

## 2023-09-14 ENCOUNTER — Encounter

## 2023-09-14 ENCOUNTER — Other Ambulatory Visit

## 2023-10-02 ENCOUNTER — Ambulatory Visit
Admission: RE | Admit: 2023-10-02 | Discharge: 2023-10-02 | Disposition: A | Discharge status: Home / Self Care | Source: Ambulatory Visit | Attending: Obstetrics and Gynecology | Admitting: Obstetrics and Gynecology

## 2023-10-02 ENCOUNTER — Other Ambulatory Visit: Payer: Self-pay | Admitting: Obstetrics and Gynecology

## 2023-10-02 DIAGNOSIS — N6002 Solitary cyst of left breast: Secondary | ICD-10-CM

## 2023-10-15 ENCOUNTER — Encounter

## 2023-10-15 ENCOUNTER — Other Ambulatory Visit
